# Patient Record
Sex: Female | Born: 2000 | Race: Black or African American | Hispanic: No | Marital: Single | Smoking: Light tobacco smoker
Health system: Southern US, Community
[De-identification: ages and names within clinical notes are randomized; demographics above are authoritative.]

## PROBLEM LIST (undated history)

## (undated) DIAGNOSIS — F329 Major depressive disorder, single episode, unspecified: Secondary | ICD-10-CM

## (undated) DIAGNOSIS — J302 Other seasonal allergic rhinitis: Secondary | ICD-10-CM

## (undated) DIAGNOSIS — F32A Depression, unspecified: Secondary | ICD-10-CM

---
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
## 2018-05-22 ENCOUNTER — Encounter (HOSPITAL_COMMUNITY): Payer: Self-pay | Admitting: Emergency Medicine

## 2018-05-22 ENCOUNTER — Other Ambulatory Visit: Payer: Self-pay

## 2018-05-22 ENCOUNTER — Emergency Department (HOSPITAL_COMMUNITY): Payer: Medicaid Other

## 2018-05-22 ENCOUNTER — Ambulatory Visit (HOSPITAL_COMMUNITY)
Admission: EM | Admit: 2018-05-22 | Discharge: 2018-05-22 | Disposition: A | Discharge status: Home / Self Care | Payer: Medicaid Other | Attending: Family Medicine | Admitting: Family Medicine

## 2018-05-22 ENCOUNTER — Emergency Department (HOSPITAL_COMMUNITY)
Admission: EM | Admit: 2018-05-22 | Discharge: 2018-05-22 | Disposition: A | Discharge status: Home / Self Care | Payer: Medicaid Other | Attending: Emergency Medicine | Admitting: Emergency Medicine

## 2018-05-22 DIAGNOSIS — Y999 Unspecified external cause status: Secondary | ICD-10-CM | POA: Insufficient documentation

## 2018-05-22 DIAGNOSIS — W2103XA Struck by baseball, initial encounter: Secondary | ICD-10-CM | POA: Insufficient documentation

## 2018-05-22 DIAGNOSIS — Y929 Unspecified place or not applicable: Secondary | ICD-10-CM | POA: Diagnosis not present

## 2018-05-22 DIAGNOSIS — S0592XA Unspecified injury of left eye and orbit, initial encounter: Secondary | ICD-10-CM | POA: Diagnosis present

## 2018-05-22 DIAGNOSIS — S02832A Fracture of medial orbital wall, left side, initial encounter for closed fracture: Secondary | ICD-10-CM | POA: Diagnosis not present

## 2018-05-22 DIAGNOSIS — S058X2A Other injuries of left eye and orbit, initial encounter: Secondary | ICD-10-CM

## 2018-05-22 DIAGNOSIS — Y9364 Activity, baseball: Secondary | ICD-10-CM | POA: Diagnosis not present

## 2018-05-22 DIAGNOSIS — F17210 Nicotine dependence, cigarettes, uncomplicated: Secondary | ICD-10-CM | POA: Insufficient documentation

## 2018-05-22 MED ORDER — IBUPROFEN 800 MG PO TABS
800.0000 mg | ORAL_TABLET | Freq: Once | ORAL | Status: AC
  Administered 2018-05-22: 800 mg via ORAL
  Filled 2018-05-22: qty 1

## 2018-05-22 MED ORDER — HYDROCODONE-ACETAMINOPHEN 5-325 MG PO TABS: 1.0000 | ORAL_TABLET | Freq: Four times a day (QID) | ORAL | 0 refills | Status: DC | PRN

## 2018-05-22 MED ORDER — IBUPROFEN 800 MG PO TABS: 800.0000 mg | ORAL_TABLET | Freq: Three times a day (TID) | ORAL | 0 refills | Status: DC | PRN

## 2018-05-22 MED ORDER — OXYCODONE-ACETAMINOPHEN 5-325 MG PO TABS
1.0000 | ORAL_TABLET | Freq: Once | ORAL | Status: AC
  Administered 2018-05-22: 1 via ORAL
  Filled 2018-05-22: qty 1

## 2018-05-22 NOTE — Discharge Instructions (Signed)
Please go to the ER for imaging and follow up management.

## 2018-05-22 NOTE — ED Notes (Signed)
Pt back from CT °

## 2018-05-22 NOTE — ED Provider Notes (Signed)
[facility] Provider Note   CSN: [id number] Arrival date & time: 05/22/18  1602    History   Chief Complaint Chief Complaint  Patient presents with   Eye Injury    left    HPI Sonia Sanders is a 18 y.o. female.     The history is provided by the patient and medical records. No language interpreter was used.  Eye Injury    Sonia Sanders is a 18 y.o. female who presents to the Emergency Department from urgent care for evaluation after injury to left eye yesterday night.  Patient states that she was playing baseball with her family when she was accidentally struck to the left eye with a baseball.  Pain occurred immediately.  There was no loss of consciousness.  She woke up this morning with increased swelling and bruising around the eyelid.  She does not feel as if she has had any visual changes, other than the light causing blurred vision/pain.  She did take Tylenol last night, but has not had any medication today.  She does wear glasses, but not contacts.  At urgent care, she was evaluated and told to come to the emergency department for further work-up.   History reviewed. No pertinent past medical history.  There are no active problems to display for this patient.   History reviewed. No pertinent surgical history.   OB History   No obstetric history on file.      Home Medications    Prior to Admission medications   Medication Sig Start Date End Date Taking? Authorizing Provider  docusate sodium (COLACE) 100 MG capsule Take 1 capsule (100 mg total) by mouth every 12 (twelve) hours. Patient not taking: Reported on 05/22/2018 11/28/17   Roxy HorsemanBrowning, Robert, PA-C  HYDROcodone-acetaminophen (NORCO) 5-325 MG tablet Take 1 tablet by mouth every 6 (six) hours as needed for moderate pain. 05/22/18   Dezire Turk, Chase PicketJaime Pilcher, PA-C  ibuprofen (ADVIL) 800 MG tablet Take 1 tablet (800 mg total) by mouth every 8 (eight) hours as needed. 05/22/18    Greycen Felter, Chase PicketJaime Pilcher, PA-C    Family History No family history on file.  Social History Social History   Tobacco Use   Smoking status: Light Tobacco Smoker   Smokeless tobacco: Never Used   Tobacco comment: occ. smokes black and milds  Substance Use Topics   Alcohol use: No   Drug use: No     Allergies   Grapefruit extract   Review of Systems Review of Systems  HENT: Positive for facial swelling. Negative for sore throat and trouble swallowing.   Eyes: Positive for photophobia, pain and redness. Negative for visual disturbance.  All other systems reviewed and are negative.    Physical Exam Updated Vital Signs BP 113/70 (BP Location: Right Arm)    Pulse 76    Temp 98.1 F (36.7 C) (Oral)    Resp 16    Ht 5\' 2"  (1.575 m)    Wt 54 kg    LMP 05/01/2018    SpO2 97%    BMI 21.77 kg/m   Physical Exam Vitals signs and nursing note reviewed.  Constitutional:      General: She is not in acute distress.    Appearance: She is well-developed.  HENT:     Head: Normocephalic and atraumatic.  Eyes:     Comments: EOM's intact, however does have pain with looking down and to the right. Subconjunctival hemorrhage to left lateral eye. PERRL.  Neck:     Musculoskeletal: Neck supple.  Cardiovascular:     Rate and Rhythm: Normal rate and regular rhythm.     Heart sounds: Normal heart sounds. No murmur.  Pulmonary:     Effort: Pulmonary effort is normal. No respiratory distress.     Breath sounds: Normal breath sounds.  Skin:    General: Skin is warm and dry.  Neurological:     Mental Status: She is alert and oriented to person, place, and time.      ED Treatments / Results  Labs (all labs ordered are listed, but only abnormal results are displayed) Labs Reviewed - No data to display  EKG None  Radiology Ct Orbits Wo Contrast  Result Date: 05/22/2018 CLINICAL DATA:  The patient was struck in the left eye by a baseball today. Pain. Initial encounter. EXAM: CT  ORBITS WITHOUT CONTRAST TECHNIQUE: Multidetector CT images were obtained using the standard protocol without intravenous contrast. COMPARISON:  None. FINDINGS: Orbits: The patient has a fracture of the medial wall of the left orbit with depression of up to approximately 0.3 cm. No other fracture is identified. Mandibular condyles are located. Visualized sinuses: Tiny amount of hemorrhage is seen in the left ethmoid air cells at the patient's fracture and there is mild mucosal thickening in the left frontal sinus. Soft tissues: Soft tissue contusion about the left eye in noted. Limited intracranial: Normal. IMPRESSION: Mildly depressed fracture of the medial wall of the left orbit. No other fracture is identified. Negative for muscle entrapment or other orbital abnormality. Mild mucosal thickening left frontal sinus. Electronically Signed   By: Drusilla Kanner M.D.   On: 05/22/2018 18:18    Procedures Procedures (including critical care time)  Medications Ordered in ED Medications  oxyCODONE-acetaminophen (PERCOCET/ROXICET) 5-325 MG per tablet 1 tablet (1 tablet Oral Given 05/22/18 1737)  ibuprofen (ADVIL) tablet 800 mg (800 mg Oral Given 05/22/18 1737)     Initial Impression / Assessment and Plan / ED Course  I have reviewed the triage vital signs and the nursing notes.  Pertinent labs & imaging results that were available during my care of the patient were reviewed by me and considered in my medical decision making (see chart for details).       Sonia Sanders is a 18 y.o. female who presents to ED urgent care for evaluation of left thigh pain/swelling after being struck with a baseball to the eye last night.  At urgent care, patient did have visual acuity and IOP's checked. IOP of affected eye was 23.  Visual acuity of 20/50 in affected eye with 20/20 bilaterally and to the right eye.  Patient does state that she typically wears glasses, but does not have them with her.  She has good EOMs but  has pain looking both to the right and downward.  No hyphema noted.  CT of the orbits was performed showing the depressed fracture of the medial wall of the left orbit.  No entrapment or other abnormalities were noted.   7:04 PM - Spoke with Dr. Jearld Fenton, ENT facial trauma, who recommends treating symptomatically with follow up in ENT office in about 5 days. No prophylactic ABX needed.   Discussed plan of care with patient who is in agreement with this plan.  [place] controlled substance database consulted with no active prescriptions.  We will give short course of pain medication over the next few days until she can follow-up with ENT.  Reasons to return to the  emergency department were discussed and all questions were answered.   Final Clinical Impressions(s) / ED Diagnoses   Final diagnoses:  Closed fracture of medial wall of left orbit, initial encounter    ED Discharge Orders         Ordered    ibuprofen (ADVIL) 800 MG tablet  Every 8 hours PRN     05/22/18 1921    HYDROcodone-acetaminophen (NORCO) 5-325 MG tablet  Every 6 hours PRN     05/22/18 1921           Mea Ozga, Chase Picket, PA-C 05/22/18 1925    Tilden Fossa, MD 05/23/18 2315

## 2018-05-22 NOTE — ED Provider Notes (Signed)
[facility]    CSN: [id number] Arrival date & time: 05/22/18  1437     History   Chief Complaint Chief Complaint  Patient presents with   Eye Injury    HPI Lucky Cowboyrmani M Kubicki is a 18 y.o. female.   Lucky CowboyArmani M Mallet presents with complaints of left eye pain. Last night while playing with her family she accidentally was struck with a baseball to her left eye ball. Immediately had pain. No loss of consciousness. She feels that the pain has worsened. Light sensitive. Bruising and swelling to the lid. Denies any significant or noticeable vision changes. Took ibuprofen last night but no medications today for her symptoms. Pain 8/10. She sometimes wears glasses for decreased distance vision. Doesn't wear contacts. She noticed increased clear tears from the eye, no other drainage. Doesn't follow regularly with her eye doctor. Without contributing medical history.      ROS per HPI, negative if not otherwise mentioned.      History reviewed. No pertinent past medical history.  There are no active problems to display for this patient.   History reviewed. No pertinent surgical history.  OB History   No obstetric history on file.      Home Medications    Prior to Admission medications   Medication Sig Start Date End Date Taking? Authorizing Provider  acetaminophen (TYLENOL) 325 MG tablet Take 325-650 mg by mouth every 6 (six) hours as needed (for pain or headaches).    [provider]  azelaic acid (AZELEX) 20 % cream Apply 1 application topically daily as needed (to affected areas of neck, chest, and shoulders for irritation).     [provider]  docusate sodium (COLACE) 100 MG capsule Take 1 capsule (100 mg total) by mouth every 12 (twelve) hours. 11/28/17   Roxy HorsemanBrowning, Robert, PA-C  ibuprofen (ADVIL,MOTRIN) 200 MG tablet Take 200-400 mg by mouth every 6 (six) hours as needed (for pain or headaches).    [provider]  methocarbamol (ROBAXIN)  500 MG tablet Take 1 tablet (500 mg total) by mouth every 8 (eight) hours as needed for muscle spasms. 05/15/17   Georgetta HaberBurky, Yicel Shannon B, NP  naproxen (NAPROSYN) 500 MG tablet Take 1 tablet (500 mg total) by mouth 2 (two) times daily. 08/14/17   Wurst, [place], PA-C  polyethylene glycol powder (GLYCOLAX/MIRALAX) powder Take 17 g by mouth 2 (two) times daily. 11/28/17   Roxy HorsemanBrowning, Robert, PA-C  sodium phosphate (FLEET) 7-19 GM/118ML ENEM Place 133 mLs (1 enema total) rectally daily as needed for severe constipation. 11/28/17   Roxy HorsemanBrowning, Robert, PA-C    Family History No family history on file.  Social History Social History   Tobacco Use   Smoking status: Light Tobacco Smoker   Smokeless tobacco: Never Used   Tobacco comment: occ. smokes black and milds  Substance Use Topics   Alcohol use: No   Drug use: No     Allergies   Grapefruit extract   Review of Systems Review of Systems   Physical Exam Triage Vital Signs ED Triage Vitals [05/22/18 1507]  Enc Vitals Group     BP 114/79     Pulse Rate 64     Resp 16     Temp 97.6 F (36.4 C)     Temp src      SpO2 100 %     Weight      Height      Head Circumference      Peak Flow  Pain Score 8     Pain Loc      Pain Edu?      Excl. in GC?    No data found.  Updated Vital Signs BP 114/79    Pulse 64    Temp 97.6 F (36.4 C)    Resp 16    LMP 05/01/2018    SpO2 100%   Visual Acuity Right Eye Distance:   Left Eye Distance:   Bilateral Distance:    Right Eye Near: R Near: 20/20 Left Eye Near:  L Near: 20/50 Bilateral Near:  20/20  Physical Exam Constitutional:      General: She is not in acute distress.    Appearance: She is well-developed.  Eyes:     Intraocular pressure: Right eye pressure is 13 mmHg. Left eye pressure is 23 mmHg. Measurements were taken using a handheld tonometer.    Extraocular Movements:     Right eye: Normal extraocular motion.     Left eye: Normal extraocular motion.      Conjunctiva/sclera:     Left eye: Hemorrhage present.     Pupils:     Left eye: Pupil is sluggish. Pupil is round and reactive. No corneal abrasion or fluorescein uptake.     Slit lamp exam:    Left eye: Photophobia present. No hyphema.     Comments: No obvious hyphema present to left eye; swelling to left upper lateral and lower eye lid; conjunctival hemorrhage to left lateral eye; pain to left eye with ROM in right direction  Cardiovascular:     Rate and Rhythm: Normal rate and regular rhythm.     Heart sounds: Normal heart sounds.  Pulmonary:     Effort: Pulmonary effort is normal.     Breath sounds: Normal breath sounds.  Skin:    General: Skin is warm and dry.  Neurological:     Mental Status: She is alert and oriented to person, place, and time.      UC Treatments / Results  Labs (all labs ordered are listed, but only abnormal results are displayed) Labs Reviewed - No data to display  EKG None  Radiology No results found.  Procedures Procedures (including critical care time)  Medications Ordered in UC Medications - No data to display  Initial Impression / Assessment and Plan / UC Course  I have reviewed the triage vital signs and the nursing notes.  Pertinent labs & imaging results that were available during my care of the patient were reviewed by me and considered in my medical decision making (see chart for details).     Baseball trauma to left eye, last night. Photophobia, pain with ROm, sluggish left pupil. No obvious hyphema. Spoke with on-call ophthalmologist Dr. Sherrine Maples who does recommend facial CT at this time. He is agreeable to ER reaching out to him with results as he still may see patient tonight or tomorrow morning as needed. Patient agreeable to plan and self transport to ER.  Final Clinical Impressions(s) / UC Diagnoses   Final diagnoses:  Eye trauma, superficial, left, initial encounter     Discharge Instructions     Please go to the ER for  imaging and follow up management.     ED Prescriptions    None     Controlled Substance Prescriptions [place] Controlled Substance Registry consulted? Not Applicable   Georgetta Haber, NP 05/22/18 1558

## 2018-05-22 NOTE — Discharge Instructions (Signed)
It was my pleasure taking care of you today!   Ice / cold compress to area to help with swelling and pain.  Ibuprofen for mild to moderate pain. Norco only as needed for severe pain - This can make you very drowsy - please do not drink alcohol, operate heavy machinery or drive on this medication.   Please call the doctor listed Monday morning to schedule a follow up appointment. I spoke with Dr. Jearld Fenton who recommended you see him or one of his colleagues in 5 days.   Return to ER for new or worsening symptoms, any additional concerns.

## 2018-05-22 NOTE — ED Triage Notes (Signed)
Pt states she was hit in the face with a baseball. redness to left eye with pain radiating into the left jaw. No visual field changes, does have sensitivity to light.

## 2018-05-22 NOTE — ED Triage Notes (Signed)
Pt states someone threw a baseball last night and it hit her in the L eye. Pt has swelling to L eye, states light is bothering it.

## 2018-05-22 NOTE — ED Notes (Signed)
Patient transported to CT °

## 2018-08-24 ENCOUNTER — Encounter (HOSPITAL_COMMUNITY): Payer: Self-pay | Admitting: Emergency Medicine

## 2018-08-24 ENCOUNTER — Emergency Department (HOSPITAL_COMMUNITY): Payer: Medicaid Other

## 2018-08-24 ENCOUNTER — Emergency Department (HOSPITAL_COMMUNITY)
Admission: EM | Admit: 2018-08-24 | Discharge: 2018-08-24 | Disposition: A | Discharge status: Home / Self Care | Payer: Medicaid Other | Attending: Emergency Medicine | Admitting: Emergency Medicine

## 2018-08-24 ENCOUNTER — Other Ambulatory Visit: Payer: Self-pay

## 2018-08-24 DIAGNOSIS — Y9289 Other specified places as the place of occurrence of the external cause: Secondary | ICD-10-CM | POA: Insufficient documentation

## 2018-08-24 DIAGNOSIS — Y999 Unspecified external cause status: Secondary | ICD-10-CM | POA: Diagnosis not present

## 2018-08-24 DIAGNOSIS — Y29XXXA Contact with blunt object, undetermined intent, initial encounter: Secondary | ICD-10-CM | POA: Insufficient documentation

## 2018-08-24 DIAGNOSIS — M25531 Pain in right wrist: Secondary | ICD-10-CM | POA: Diagnosis not present

## 2018-08-24 DIAGNOSIS — M79641 Pain in right hand: Secondary | ICD-10-CM

## 2018-08-24 DIAGNOSIS — F17210 Nicotine dependence, cigarettes, uncomplicated: Secondary | ICD-10-CM | POA: Insufficient documentation

## 2018-08-24 DIAGNOSIS — Y9389 Activity, other specified: Secondary | ICD-10-CM | POA: Insufficient documentation

## 2018-08-24 MED ORDER — NAPROXEN 250 MG PO TABS
500.0000 mg | ORAL_TABLET | Freq: Once | ORAL | Status: AC
  Administered 2018-08-24: 500 mg via ORAL
  Filled 2018-08-24: qty 2

## 2018-08-24 MED ORDER — NAPROXEN 500 MG PO TABS: 500.0000 mg | ORAL_TABLET | Freq: Two times a day (BID) | ORAL | 0 refills | Status: DC

## 2018-08-24 NOTE — ED Triage Notes (Signed)
C/o R hand pain since punching a washing machine pta.

## 2018-08-24 NOTE — ED Provider Notes (Signed)
[facility]   CSN: [id number] Arrival date & time: 08/24/18  0319     History   Chief Complaint Chief Complaint  Patient presents with   Hand Pain    HPI Sonia Sanders is a 18 y.o. female.     The history is provided by the patient and medical records.     18 year old female presenting to the ED with right hand and wrist pain.  States she was very upset tonight as her mother recently passed away and she punched several items in the home, the last of which she thinks was the dryer which caused a lot of pain to her volar right wrist.  He states she has increased pain when moving her fingers or making a fist.  She denies any numbness or weakness.  She is right-hand dominant.  No intervention tried prior to arrival.  History reviewed. No pertinent past medical history.  There are no active problems to display for this patient.   History reviewed. No pertinent surgical history.   OB History   No obstetric history on file.      Home Medications    Prior to Admission medications   Medication Sig Start Date End Date Taking? Authorizing Provider  docusate sodium (COLACE) 100 MG capsule Take 1 capsule (100 mg total) by mouth every 12 (twelve) hours. Patient not taking: Reported on 05/22/2018 11/28/17   Roxy HorsemanBrowning, Robert, PA-C  HYDROcodone-acetaminophen (NORCO) 5-325 MG tablet Take 1 tablet by mouth every 6 (six) hours as needed for moderate pain. 05/22/18   Ward, Chase PicketJaime Pilcher, PA-C  ibuprofen (ADVIL) 800 MG tablet Take 1 tablet (800 mg total) by mouth every 8 (eight) hours as needed. 05/22/18   Ward, Chase PicketJaime Pilcher, PA-C    Family History No family history on file.  Social History Social History   Tobacco Use   Smoking status: Light Tobacco Smoker   Smokeless tobacco: Never Used   Tobacco comment: occ. smokes black and milds  Substance Use Topics   Alcohol use: No   Drug use: No     Allergies   Grapefruit  extract   Review of Systems Review of Systems  Musculoskeletal: Positive for arthralgias.  All other systems reviewed and are negative.    Physical Exam Updated Vital Signs BP (!) 103/56 (BP Location: Left Arm)    Pulse 84    Temp 98.7 F (37.1 C) (Oral)    Resp 20    LMP 08/19/2018    SpO2 99%   Physical Exam Vitals signs and nursing note reviewed.  Constitutional:      Appearance: She is well-developed.  HENT:     Head: Normocephalic and atraumatic.  Eyes:     Conjunctiva/sclera: Conjunctivae normal.     Pupils: Pupils are equal, round, and reactive to light.  Neck:     Musculoskeletal: Normal range of motion.  Cardiovascular:     Rate and Rhythm: Normal rate and regular rhythm.     Heart sounds: Normal heart sounds.  Pulmonary:     Effort: Pulmonary effort is normal.     Breath sounds: Normal breath sounds.  Abdominal:     General: Bowel sounds are normal.     Palpations: Abdomen is soft.  Musculoskeletal: Normal range of motion.     Comments: Small contusion noted to the radial aspect of right wrist, there is no acute deformity, pain with attempted movement of the fingers and wrist, no significant swelling, radial pulse intact,  normal distal sensation and cap refill  Skin:    General: Skin is warm and dry.  Neurological:     Mental Status: She is alert and oriented to person, place, and time.      ED Treatments / Results  Labs (all labs ordered are listed, but only abnormal results are displayed) Labs Reviewed - No data to display  EKG None  Radiology Dg Hand Complete Right  Result Date: 08/24/2018 CLINICAL DATA:  Initial evaluation for acute right hand pain status post trauma. EXAM: RIGHT HAND - COMPLETE 3+ VIEW COMPARISON:  None. FINDINGS: There is no evidence of fracture or dislocation. There is no evidence of arthropathy or other focal bone abnormality. Soft tissues are unremarkable. IMPRESSION: No acute osseous abnormality about the right hand.  Electronically Signed   By: Jeannine Boga M.D.   On: 08/24/2018 03:47    Procedures .Splint Application  Date/Time: 08/24/2018 4:19 AM Performed by: Larene Pickett, PA-C Authorized by: Larene Pickett, PA-C   Consent:    Consent obtained:  Verbal   Consent given by:  Patient   Risks discussed:  Numbness and discoloration   Alternatives discussed:  No treatment and delayed treatment Pre-procedure details:    Sensation:  Normal Procedure details:    Laterality:  Right   Location:  Wrist   Wrist:  R wrist   Strapping: yes     Cast type:  Short arm   Splint type:  Wrist   Supplies:  Aluminum splint Post-procedure details:    Pain:  Improved   Sensation:  Normal   Patient tolerance of procedure:  Tolerated well, no immediate complications   (including critical care time)  Medications Ordered in ED Medications - No data to display   Initial Impression / Assessment and Plan / ED Course  I have reviewed the triage vital signs and the nursing notes.  Pertinent labs & imaging results that were available during my care of the patient were reviewed by me and considered in my medical decision making (see chart for details).  18 year old female presenting to the ED with right hand and wrist pain after punching a dryer at home.  She has been upset to the passing of her mother.  She has area of contusion to the radial aspect of right volar wrist but otherwise exam is atraumatic.  Her hand is neurovascular intact.  X-ray of that hand obtained, however this is low enough to fully visualize wrist and there are no bony findings noted.  Patient placed in wrist splint for comfort, start NSAIDs.  Given hand surgery follow-up for any acute complications or if no improvement with supportive care.  Return here for any new/acute changes.  Final Clinical Impressions(s) / ED Diagnoses   Final diagnoses:  Right hand pain  Right wrist pain    ED Discharge Orders         Ordered     naproxen (NAPROSYN) 500 MG tablet  2 times daily with meals     08/24/18 0421           Larene Pickett, PA-C 08/24/18 0430    Fatima Blank, MD 08/24/18 [phone]

## 2018-08-24 NOTE — Discharge Instructions (Signed)
Wear splint for comfort, you can loosen and tighten as needed.  Ice will help too. Take naprosyn as directed. Follow-up with hand specialist if not improving in the next week or if worsening. Return here for any new/acute changes.

## 2018-08-24 NOTE — ED Notes (Signed)
Pt given a ice pack for rt forearm

## 2018-08-24 NOTE — ED Notes (Signed)
Patient verbalized understanding of discharge instructions. Opportunity for questions were provided. Pt. ambulatory and discharged home.

## 2018-12-10 ENCOUNTER — Emergency Department (HOSPITAL_COMMUNITY)
Admission: EM | Admit: 2018-12-10 | Discharge: 2018-12-10 | Disposition: A | Discharge status: Home / Self Care | Payer: Medicaid Other | Attending: Emergency Medicine | Admitting: Emergency Medicine

## 2018-12-10 ENCOUNTER — Encounter (HOSPITAL_COMMUNITY): Payer: Self-pay | Admitting: Emergency Medicine

## 2018-12-10 DIAGNOSIS — F329 Major depressive disorder, single episode, unspecified: Secondary | ICD-10-CM

## 2018-12-10 DIAGNOSIS — R45851 Suicidal ideations: Secondary | ICD-10-CM | POA: Diagnosis not present

## 2018-12-10 DIAGNOSIS — Z720 Tobacco use: Secondary | ICD-10-CM | POA: Diagnosis not present

## 2018-12-10 DIAGNOSIS — F32A Depression, unspecified: Secondary | ICD-10-CM

## 2018-12-10 HISTORY — DX: Depression, unspecified: F32.A

## 2018-12-10 LAB — COMPREHENSIVE METABOLIC PANEL
ALT: 13 U/L (ref 0–44)
AST: 33 U/L (ref 15–41)
Albumin: 4.5 g/dL (ref 3.5–5.0)
Alkaline Phosphatase: 46 U/L (ref 38–126)
Anion gap: 11 (ref 5–15)
BUN: 12 mg/dL (ref 6–20)
CO2: 24 mmol/L (ref 22–32)
Calcium: 9.8 mg/dL (ref 8.9–10.3)
Chloride: 105 mmol/L (ref 98–111)
Creatinine, Ser: 0.76 mg/dL (ref 0.44–1.00)
GFR calc Af Amer: >60 mL/min (ref >60)
GFR calc non Af Amer: >60 mL/min (ref >60)
Glucose, Bld: 87 mg/dL (ref 70–99)
Potassium: 3.9 mmol/L (ref 3.5–5.1)
Sodium: 140 mmol/L (ref 135–145)
Total Bilirubin: 1 mg/dL (ref 0.3–1.2)
Total Protein: 7.3 g/dL (ref 6.5–8.1)

## 2018-12-10 LAB — ACETAMINOPHEN LEVEL: Acetaminophen (Tylenol), Serum: <10 ug/mL — ABNORMAL LOW (ref 10–30)

## 2018-12-10 LAB — CBC
HCT: 39.9 % (ref 36.0–46.0)
Hemoglobin: 12.9 g/dL (ref 12.0–15.0)
MCH: 30.4 pg (ref 26.0–34.0)
MCHC: 32.3 g/dL (ref 30.0–36.0)
MCV: 94.1 fL (ref 80.0–100.0)
Platelets: 319 10*3/uL (ref 150–400)
RBC: 4.24 MIL/uL (ref 3.87–5.11)
RDW: 13.6 % (ref 11.5–15.5)
WBC: 9.8 10*3/uL (ref 4.0–10.5)
nRBC: 0 % (ref 0.0–0.2)

## 2018-12-10 LAB — ETHANOL: Alcohol, Ethyl (B): <10 mg/dL (ref <10)

## 2018-12-10 LAB — SALICYLATE LEVEL: Salicylate Lvl: <7 mg/dL (ref 2.8–30.0)

## 2018-12-10 NOTE — ED Notes (Signed)
Pt given back belongings and discharged

## 2018-12-10 NOTE — Discharge Instructions (Addendum)
Contact a health care provider if: °You stop taking your antidepressant medicines, and you have any of these symptoms: °Nausea. °Headache. °Feeling lightheaded. °Chills and body aches. °Not being able to sleep (insomnia). °You or your friends and family think your depression is getting worse. °Get help right away if: °You have thoughts of hurting yourself or others. °

## 2018-12-10 NOTE — ED Provider Notes (Signed)
[facility]ER[facility]ENCY DEPARTMENT Provider Note   CSN: [id number] Arrival date & time: 12/10/18  1819     History   Chief Complaint Chief Complaint  Patient presents with   Suicidal    HPI Sonia Sanders is a 18 y.o. female who presents to the emergency department for depression.  Patient states that she has never sought help for her depression but has been depressed since she was about 18 years old.  She complains of feeling sad at all times.  She states that she cries for no reason she cannot find a way to feel any joy or happiness.  She is lost interest in things that make her happy.  She has passive suicidal ideation and sometimes thinks about jumping off of a bridge but has no active plans to harm herself and states that she does not want to kill herself but also does not have the energy to do it.  She denies any homicidal ideation.  She is not seeking psychiatric hospitalization.  She uses marijuana and states "I drink sometimes when I am sad" but denies daily drinking.     HPI  Past Medical History:  Diagnosis Date   Depression     There are no active problems to display for this patient.   History reviewed. No pertinent surgical history.   OB History   No obstetric history on file.      Home Medications    Prior to Admission medications   Medication Sig Start Date End Date Taking? Authorizing Provider  docusate sodium (COLACE) 100 MG capsule Take 1 capsule (100 mg total) by mouth every 12 (twelve) hours. Patient not taking: Reported on 05/22/2018 11/28/17   Montine Circle, PA-C  HYDROcodone-acetaminophen (NORCO) 5-325 MG tablet Take 1 tablet by mouth every 6 (six) hours as needed for moderate pain. 05/22/18   Ward, Ozella Almond, PA-C  ibuprofen (ADVIL) 800 MG tablet Take 1 tablet (800 mg total) by mouth every 8 (eight) hours as needed. 05/22/18   Ward, Ozella Almond, PA-C  naproxen (NAPROSYN) 500 MG tablet Take 1 tablet (500 mg total) by mouth  2 (two) times daily with a meal. 08/24/18   Larene Pickett, PA-C    Family History No family history on file.  Social History Social History   Tobacco Use   Smoking status: Light Tobacco Smoker   Smokeless tobacco: Never Used   Tobacco comment: occ. smokes black and milds  Substance Use Topics   Alcohol use: No   Drug use: No     Allergies   Grapefruit extract   Review of Systems Review of Systems  Constitutional: Negative.   HENT: Negative.   Eyes: Negative.   Respiratory: Negative.   Cardiovascular: Negative.   Gastrointestinal: Negative.   Genitourinary: Negative.   Musculoskeletal: Negative.   Neurological: Negative.   Psychiatric/Behavioral: Positive for dysphoric mood, sleep disturbance and suicidal ideas. Negative for confusion, hallucinations and self-injury. The patient is not nervous/anxious.      Physical Exam Updated Vital Signs BP 125/83    Pulse 94    Temp 98.6 F (37 C) (Oral)    Resp 16    Ht 5\' 3"  (1.6 m)    Wt 48.5 kg    LMP 12/07/2018    SpO2 100%    BMI 18.95 kg/m   Physical Exam Vitals signs and nursing note reviewed.  Constitutional:      General: She is not in acute distress.    Appearance: She  is well-developed. She is not diaphoretic.  HENT:     Head: Normocephalic and atraumatic.  Eyes:     General: No scleral icterus.    Conjunctiva/sclera: Conjunctivae normal.  Neck:     Musculoskeletal: Normal range of motion.  Cardiovascular:     Rate and Rhythm: Normal rate and regular rhythm.     Heart sounds: Normal heart sounds. No murmur. No friction rub. No gallop.   Pulmonary:     Effort: Pulmonary effort is normal. No respiratory distress.     Breath sounds: Normal breath sounds.  Abdominal:     General: Bowel sounds are normal. There is no distension.     Palpations: Abdomen is soft. There is no mass.     Tenderness: There is no abdominal tenderness. There is no guarding.  Skin:    General: Skin is warm and dry.   Neurological:     Mental Status: She is alert and oriented to person, place, and time.  Psychiatric:        Attention and Perception: Attention normal.        Mood and Affect: Mood is depressed. Affect is blunt and flat.        Speech: Speech normal.        Behavior: Behavior normal.        Thought Content: Thought content normal.      ED Treatments / Results  Labs (all labs ordered are listed, but only abnormal results are displayed) Labs Reviewed  ACETAMINOPHEN LEVEL - Abnormal; Notable for the following components:      Result Value   Acetaminophen (Tylenol), Serum <10 (*)    All other components within normal limits  COMPREHENSIVE METABOLIC PANEL  ETHANOL  SALICYLATE LEVEL  CBC  RAPID URINE DRUG SCREEN, HOSP PERFORMED    EKG None  Radiology No results found.  Procedures Procedures (including critical care time)  Medications Ordered in ED Medications - No data to display   Initial Impression / Assessment and Plan / ED Course  I have reviewed the triage vital signs and the nursing notes.  Pertinent labs & imaging results that were available during my care of the patient were reviewed by me and considered in my medical decision making (see chart for details).        18 year old female here with complaint of depression.  I personally reviewed the patient's lab results which show no abnormalities.  Patient is not actively suicidal.  She does not want to be hospitalized.  She is not an active risk for completed suicide on my assessment.  Patient labs show no acute abnormalities.  I consulted with case management who gave the patient outpatient resources.  She will call family services tomorrow morning.  Appears appropriate for discharge at this time  Final Clinical Impressions(s) / ED Diagnoses   Final diagnoses:  Depression, unspecified depression type  Suicidal ideations    ED Discharge Orders    None       Arthor Captain, PA-C 12/10/18 2213     Benjiman Core, MD 12/10/18 2324

## 2018-12-10 NOTE — ED Triage Notes (Signed)
Pt. Stated, Im trying to figure out what Im doing or why Im here. Sonia Sanders been depresse and suicidal since I was 18 years old.

## 2019-02-08 ENCOUNTER — Encounter (HOSPITAL_COMMUNITY): Payer: Self-pay

## 2019-02-08 ENCOUNTER — Ambulatory Visit (HOSPITAL_COMMUNITY)
Admission: EM | Admit: 2019-02-08 | Discharge: 2019-02-08 | Disposition: A | Discharge status: Home / Self Care | Payer: Medicaid Other | Attending: Family Medicine | Admitting: Family Medicine

## 2019-02-08 ENCOUNTER — Other Ambulatory Visit: Payer: Self-pay

## 2019-02-08 DIAGNOSIS — H00022 Hordeolum internum right lower eyelid: Secondary | ICD-10-CM

## 2019-02-08 MED ORDER — ERYTHROMYCIN 5 MG/GM OP OINT: TOPICAL_OINTMENT | OPHTHALMIC | 0 refills | Status: DC

## 2019-02-08 NOTE — Discharge Instructions (Signed)
Stye  -Warm compresses multiple times a day with massage afterward  - Do not wear contacts or make up  - Keep lid clean, may use baby shampoo diluted  - Erythromycin ointment 4-6 times daily x 1 week - Return if increasing, redness, pain or swelling, decreased vision.

## 2019-02-08 NOTE — ED Triage Notes (Signed)
Pt c/o right eye pain, irritation, swelling x1 week. Denies injury to area, denies drainage. Denies nasal congestion, but pt audibly clearing secretions.

## 2019-02-08 NOTE — ED Provider Notes (Signed)
[facility]    CSN: [id number] Arrival date & time: 02/08/19  1305      History   Chief Complaint Chief Complaint  Patient presents with  . Eye Problem    HPI Sonia Sanders is a 19 y.o. female no significant past medical history presenting today for evaluation of right eye pain and irritation.  Patient states that her symptoms began approximately 1 week ago.  She has had some pain and swelling noted to her lower outer eyelid.  Recently she has developed some light sensitivity.  She has had some watery drainage.  Denies any pus.  Denies noticing any redness to her eye.  Denies any vision changes.  Has previously worn contacts, but has not worn them in the past 3 months.  HPI  Past Medical History:  Diagnosis Date  . Depression     There are no problems to display for this patient.   History reviewed. No pertinent surgical history.  OB History   No obstetric history on file.      Home Medications    Prior to Admission medications   Medication Sig Start Date End Date Taking? Authorizing Provider  erythromycin ophthalmic ointment Place a 1/2 inch ribbon of ointment into the lower eyelid 4-6 times daily x 1 week 02/08/19   Gyanna Jarema, Junius Creamer, PA-C    Family History Family History  Problem Relation Age of Onset  . Crohn's disease Mother   . Healthy Father     Social History Social History   Tobacco Use  . Smoking status: Light Tobacco Smoker    Types: Cigars  . Smokeless tobacco: Never Used  . Tobacco comment: occ. smokes black and milds  Substance Use Topics  . Alcohol use: Yes  . Drug use: Yes    Types: Marijuana     Allergies   Grapefruit extract   Review of Systems Review of Systems  Constitutional: Negative for activity change, appetite change, chills, fatigue and fever.  HENT: Negative for congestion, ear pain, rhinorrhea, sinus pressure, sore throat and trouble swallowing.   Eyes: Positive for pain. Negative for discharge and  redness.  Respiratory: Negative for cough, chest tightness and shortness of breath.   Cardiovascular: Negative for chest pain.  Gastrointestinal: Negative for abdominal pain, diarrhea, nausea and vomiting.  Musculoskeletal: Negative for myalgias.  Skin: Negative for rash.  Neurological: Negative for dizziness, light-headedness and headaches.     Physical Exam Triage Vital Signs ED Triage Vitals  Enc Vitals Group     BP 02/08/19 1331 102/65     Pulse Rate 02/08/19 1331 (!) 59     Resp 02/08/19 1331 16     Temp 02/08/19 1331 98.7 F (37.1 C)     Temp Source 02/08/19 1331 Oral     SpO2 02/08/19 1331 100 %     Weight --      Height --      Head Circumference --      Peak Flow --      Pain Score 02/08/19 1328 7     Pain Loc --      Pain Edu? --      Excl. in GC? --    No data found.  Updated Vital Signs BP 102/65 (BP Location: Left Arm)   Pulse (!) 59   Temp 98.7 F (37.1 C) (Oral)   Resp 16   LMP 02/03/2019   SpO2 100%   Visual Acuity Right Eye Distance: 20/30 Left Eye Distance: 20/200  Bilateral Distance: 20/30  Right Eye Near:   Left Eye Near:    Bilateral Near:     Physical Exam Vitals and nursing note reviewed.  Constitutional:      Appearance: She is well-developed.     Comments: No acute distress  HENT:     Head: Normocephalic and atraumatic.     Nose: Nose normal.  Eyes:     Extraocular Movements: Extraocular movements intact.     Conjunctiva/sclera: Conjunctivae normal.     Pupils: Pupils are equal, round, and reactive to light.     Comments: Bilateral conjunctive a white, lower internal lid of the right eye with erythema and swelling, mild overlying tenderness, no significant erythema noted to external lid, no consensual photophobia  Cardiovascular:     Rate and Rhythm: Normal rate.  Pulmonary:     Effort: Pulmonary effort is normal. No respiratory distress.  Abdominal:     General: There is no distension.  Musculoskeletal:        General:  Normal range of motion.     Cervical back: Neck supple.  Skin:    General: Skin is warm and dry.  Neurological:     Mental Status: She is alert and oriented to person, place, and time.      UC Treatments / Results  Labs (all labs ordered are listed, but only abnormal results are displayed) Labs Reviewed - No data to display  EKG   Radiology No results found.  Procedures Procedures (including critical care time)  Medications Ordered in UC Medications - No data to display  Initial Impression / Assessment and Plan / UC Course  I have reviewed the triage vital signs and the nursing notes.  Pertinent labs & imaging results that were available during my care of the patient were reviewed by me and considered in my medical decision making (see chart for details).     Exam suggestive of hordeolum.  Will initiate on erythromycin ointment and have apply warm compresses with massage.  No sign of preseptal cellulitis at this time or conjunctivitis.  Follow-up if not improving or worsening.  Discussed strict return precautions. Patient verbalized understanding and is agreeable with plan.  Final Clinical Impressions(s) / UC Diagnoses   Final diagnoses:  Hordeolum internum of right lower eyelid     Discharge Instructions     Stye  -Warm compresses multiple times a day with massage afterward  - Do not wear contacts or make up  - Keep lid clean, may use baby shampoo diluted  - Erythromycin ointment 4-6 times daily x 1 week - Return if increasing, redness, pain or swelling, decreased vision.    ED Prescriptions    Medication Sig Dispense Auth. Provider   erythromycin ophthalmic ointment Place a 1/2 inch ribbon of ointment into the lower eyelid 4-6 times daily x 1 week 3.5 g Penni Penado, Levasy C, PA-C     PDMP not reviewed this encounter.   Janith Lima, PA-C 02/08/19 1352

## 2019-05-05 ENCOUNTER — Encounter (HOSPITAL_COMMUNITY): Payer: Self-pay | Admitting: Emergency Medicine

## 2019-05-05 ENCOUNTER — Emergency Department (HOSPITAL_COMMUNITY)
Admission: EM | Admit: 2019-05-05 | Discharge: 2019-05-05 | Disposition: A | Discharge status: Home / Self Care | Payer: Medicaid Other | Attending: Emergency Medicine | Admitting: Emergency Medicine

## 2019-05-05 ENCOUNTER — Other Ambulatory Visit: Payer: Self-pay

## 2019-05-05 DIAGNOSIS — F17210 Nicotine dependence, cigarettes, uncomplicated: Secondary | ICD-10-CM | POA: Diagnosis not present

## 2019-05-05 DIAGNOSIS — R21 Rash and other nonspecific skin eruption: Secondary | ICD-10-CM | POA: Diagnosis present

## 2019-05-05 DIAGNOSIS — T7840XA Allergy, unspecified, initial encounter: Secondary | ICD-10-CM

## 2019-05-05 DIAGNOSIS — L509 Urticaria, unspecified: Secondary | ICD-10-CM

## 2019-05-05 DIAGNOSIS — L500 Allergic urticaria: Secondary | ICD-10-CM | POA: Diagnosis not present

## 2019-05-05 MED ORDER — DIPHENHYDRAMINE HCL 50 MG/ML IJ SOLN
25.0000 mg | Freq: Once | INTRAMUSCULAR | Status: AC
  Administered 2019-05-05: 25 mg via INTRAMUSCULAR
  Filled 2019-05-05: qty 1

## 2019-05-05 MED ORDER — DEXAMETHASONE SODIUM PHOSPHATE 10 MG/ML IJ SOLN
8.0000 mg | Freq: Once | INTRAMUSCULAR | Status: AC
  Administered 2019-05-05: 8 mg via INTRAMUSCULAR
  Filled 2019-05-05: qty 1

## 2019-05-05 NOTE — ED Provider Notes (Signed)
[facility] Emergency Department Provider Note MRN:  [id number]  Arrival date & time: 05/05/19     Chief Complaint   Allergic Reaction (hives)   History of Present Illness   Sonia Sanders is a 19 y.o. year-old female with no pertinent past medical history presenting to the ED with chief complaint of allergic reaction.  Patient began experiencing itchy hives and rash to her face, arms, torso, back, upper legs this morning at 3 AM, persisting since then. Very itchy, moderately uncomfortable. She denies shortness of breath, no swelling to the face or tongue or throat, no chest pain, no abdominal pain, no vomiting, no diarrhea. 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  Review of Systems  A complete 10 system review of systems was obtained and all systems are negative except as noted in the HPI and PMH.   Patient's Health History    Past Medical History:  Diagnosis Date  . Depression     History reviewed. No pertinent surgical history.  Family History  Problem Relation Age of Onset  . Crohn's disease Mother   . Healthy Father     Social History   Socioeconomic History  . Marital status: Single    Spouse name: Not on file  . Number of children: Not on file  . Years of education: Not on file  . Highest education level: Not on file  Occupational History  . Not on file  Tobacco Use  . Smoking status: Light Tobacco Smoker    Types: Cigars  . Smokeless tobacco: Never Used  . Tobacco comment: occ. smokes black and milds  Substance and Sexual Activity  . Alcohol use: Yes  . Drug use: Yes    Types: Marijuana  . Sexual activity: Not on file  Other Topics Concern  . Not on file  Social History Narrative  . Not on file   Social Determinants of Health   Financial Resource Strain:   . Difficulty of Paying  Living Expenses:   Food Insecurity:   . Worried About Charity fundraiser in the Last Year:   . Arboriculturist in the Last Year:   Transportation Needs:   . Film/video editor (Medical):   Marland Kitchen Lack of Transportation (Non-Medical):   Physical Activity:   . Days of Exercise per Week:   . Minutes of Exercise per Session:   Stress:   . Feeling of Stress :   Social Connections:   . Frequency of Communication with Friends and Family:   . Frequency of Social Gatherings with Friends and Family:   . Attends Religious Services:   . Active Member of Clubs or Organizations:   . Attends Archivist Meetings:   Marland Kitchen Marital Status:   Intimate Partner Violence:   . Fear of Current or Ex-Partner:   . Emotionally Abused:   Marland Kitchen Physically Abused:   . Sexually Abused:      Physical Exam   Vitals:   05/05/19 0355 05/05/19 0645  BP: 108/71 102/70  Pulse: 84 63  Resp: 17 13  Temp: 98 F (36.7 C)   SpO2: 97% 99%    CONSTITUTIONAL: Well-appearing, NAD NEURO:  Alert and oriented x 3, no focal deficits EYES:  eyes equal and reactive ENT/NECK:  no LAD, no JVD CARDIO: Regular rate, well-perfused, normal S1 and S2  PULM:  CTAB no wheezing or rhonchi GI/GU:  normal bowel sounds, non-distended, non-tender MSK/SPINE:  No gross deformities, no edema SKIN: Papular erythematous scattered rash to the upper arms, torso PSYCH:  Appropriate speech and behavior  *Additional and/or pertinent findings included in MDM below  Diagnostic and Interventional Summary    EKG Interpretation  Date/Time:    Ventricular Rate:    PR Interval:    QRS Duration:   QT Interval:    QTC Calculation:   R Axis:     Text Interpretation:        Labs Reviewed - No data to display  No orders to display    Medications  dexamethasone (DECADRON) injection 8 mg (8 mg Intramuscular Given 05/05/19 0950)  diphenhydrAMINE (BENADRYL) injection 25 mg (25 mg Intramuscular Given 05/05/19 0951)     Procedures  /   Critical Care Procedures  ED Course and Medical Decision Making  I have reviewed the triage vital signs, the nursing notes, and pertinent available records from the EMR.  Listed above are laboratory and imaging tests that I personally ordered, reviewed, and interpreted and then considered in my medical decision making (see below for details).      Rash seems consistent with hives related to allergy. Nothing to suggest anaphylaxis or more significant rash related disease. Normal vital signs. Will provide steroids, Benadryl, reassess, anticipating discharge.  Upon reassessment patient is feeling better, continues to have no airway concerns, no shortness of breath, no swelling.  Appropriate for discharge with Benadryl and allergist follow-up.  Elmer Sow. Pilar Plate, MD [facility] [email or web address]  Final Clinical Impressions(s) / ED Diagnoses     ICD-10-CM   1. Allergic reaction, initial encounter  T78.40XA   2. Urticaria  L50.9     ED Discharge Orders    None       Discharge Instructions Discussed with and Provided to Patient:     Discharge Instructions     You were evaluated in the Emergency Department and after careful evaluation, we did not find any emergent condition requiring admission or further testing in the hospital.  Your exam/testing today was overall reassuring.  Your symptoms seem to be due to an allergic reaction.  Please take over-the-counter Benadryl as needed for your itching.  Please return to the Emergency Department if you experience any worsening of your condition.  We encourage you to follow up with a primary care provider.  Thank you for allowing [place] to be a part of your care.        Sabas Sous, MD 05/05/19 1013

## 2019-05-05 NOTE — ED Triage Notes (Signed)
Pt reports around the time she attemped to go to bed tonight, she started having a diffuse itchy rash to her face, arms and torso. She states rash began around 2am, she reports hx of allergy to grapefruit but denies any known contact with allergen. Denies difficulty breathing or oral swelling. Has not taken anything for the rash. resp e/u nad.

## 2019-05-05 NOTE — Discharge Instructions (Signed)
You were evaluated in the Emergency Department and after careful evaluation, we did not find any emergent condition requiring admission or further testing in the hospital.  Your exam/testing today was overall reassuring.  Your symptoms seem to be due to an allergic reaction.  Please take over-the-counter Benadryl as needed for your itching.  Please return to the Emergency Department if you experience any worsening of your condition.  We encourage you to follow up with a primary care provider.  Thank you for allowing [place] to be a part of your care.

## 2019-05-05 NOTE — ED Notes (Signed)
Pt came up to tech asking if there was anything we can give her to stop the itching she has. This tech informed pt that she needs to be seen by a provider before we can give her anything.

## 2019-05-06 ENCOUNTER — Ambulatory Visit (HOSPITAL_COMMUNITY)
Admission: EM | Admit: 2019-05-06 | Discharge: 2019-05-06 | Disposition: A | Discharge status: Home / Self Care | Payer: Medicaid Other | Attending: Family Medicine | Admitting: Family Medicine

## 2019-05-06 ENCOUNTER — Other Ambulatory Visit: Payer: Self-pay

## 2019-05-06 ENCOUNTER — Encounter (HOSPITAL_COMMUNITY): Payer: Self-pay

## 2019-05-06 DIAGNOSIS — L509 Urticaria, unspecified: Secondary | ICD-10-CM

## 2019-05-06 MED ORDER — METHYLPREDNISOLONE SODIUM SUCC 125 MG IJ SOLR
125.0000 mg | Freq: Once | INTRAMUSCULAR | Status: AC
  Administered 2019-05-06: 125 mg via INTRAMUSCULAR

## 2019-05-06 MED ORDER — METHYLPREDNISOLONE SODIUM SUCC 125 MG IJ SOLR
INTRAMUSCULAR | Status: AC
  Filled 2019-05-06: qty 2

## 2019-05-06 MED ORDER — TRIAMCINOLONE ACETONIDE 0.1 % EX CREA: 1.0000 "application " | TOPICAL_CREAM | Freq: Three times a day (TID) | CUTANEOUS | 0 refills | Status: DC

## 2019-05-06 MED ORDER — PREDNISONE 20 MG PO TABS: ORAL_TABLET | ORAL | 0 refills | Status: DC

## 2019-05-06 NOTE — Discharge Instructions (Addendum)
Take medication as prescribed. Apply steroid cream up to 3 times daily as needed while hives are present.

## 2019-05-06 NOTE — ED Provider Notes (Signed)
[facility]    CSN: [id number] Arrival date & time: 05/06/19  1447      History   Chief Complaint Chief Complaint  Patient presents with   Rash    HPI Sonia Sanders is a 19 y.o. female.   HPI  Patient presents for evaluation of rash that has been present all over her body x3 days.  She was seen at the ER 2 days ago but reports she did not receive a prescription for oral prednisone.  She has been using symptoms at home with Benadryl however has noticed the rash is extended all over her body although endorses when she received the prednisone shot at the ER the rash was present on her face resolved.  She denies any known contact with an irritant or consuming a new food or changes to cosmetics.  She denies that this is ever happened previously.  Past Medical History:  Diagnosis Date   Depression     There are no problems to display for this patient.   History reviewed. No pertinent surgical history.  OB History   No obstetric history on file.      Home Medications    Prior to Admission medications   Medication Sig Start Date End Date Taking? Authorizing Provider  erythromycin ophthalmic ointment Place a 1/2 inch ribbon of ointment into the lower eyelid 4-6 times daily x 1 week Patient not taking: Reported on 05/06/2019 02/08/19   Lew Dawes, PA-C    Family History Family History  Problem Relation Age of Onset   Crohn's disease Mother    Healthy Father     Social History Social History   Tobacco Use   Smoking status: Light Tobacco Smoker    Types: Cigars   Smokeless tobacco: Never Used   Tobacco comment: occ. smokes black and milds  Substance Use Topics   Alcohol use: Yes   Drug use: Yes    Types: Marijuana    Allergies   Grapefruit extract  Review of Systems Review of Systems Pertinent negatives listed in HPI Physical Exam Triage Vital Signs ED Triage Vitals  Enc Vitals Group     BP 05/06/19 1520 103/72     Pulse  Rate 05/06/19 1520 77     Resp 05/06/19 1520 16     Temp 05/06/19 1520 97.9 F (36.6 C)     Temp Source 05/06/19 1520 Oral     SpO2 05/06/19 1520 100 %     Weight 05/06/19 1518 113 lb (51.3 kg)     Height --      Head Circumference --      Peak Flow --      Pain Score --      Pain Loc --      Pain Edu? --      Excl. in GC? --    No data found.  Updated Vital Signs BP 103/72 (BP Location: Right Arm)    Pulse 77    Temp 97.9 F (36.6 C) (Oral)    Resp 16    Wt 113 lb (51.3 kg)    LMP 04/28/2019    SpO2 100%    BMI 20.02 kg/m   Visual Acuity Right Eye Distance:   Left Eye Distance:   Bilateral Distance:    Right Eye Near:   Left Eye Near:    Bilateral Near:     Physical Exam HENT:     Head: Normocephalic.     Nose: Nose normal.  Cardiovascular:     Rate and Rhythm: Normal rate and regular rhythm.  Pulmonary:     Effort: Pulmonary effort is normal.     Breath sounds: Normal breath sounds.  Musculoskeletal:     Cervical back: Normal range of motion.  Skin:    Findings: Rash present. Rash is urticarial.     Comments: Generalized rash   Neurological:     Mental Status: She is alert.  Psychiatric:        Mood and Affect: Mood normal.        Behavior: Behavior normal.    UC Treatments / Results  Labs (all labs ordered are listed, but only abnormal results are displayed) Labs Reviewed - No data to display  EKG   Radiology No results found.  Procedures Procedures (including critical care time)  Medications Ordered in UC Medications - No data to display  Initial Impression / Assessment and Plan / UC Course  I have reviewed the triage vital signs and the nursing notes.  Pertinent labs & imaging results that were available during my care of the patient were reviewed by me and considered in my medical decision making (see chart for details).    1. Hives 2. Urticaria Hives and urticaria of unknown etiology.  Solumedrol IM administered here in  clinic. Take Prednisone 20 mg as follows:Take 3 pills for 3 days, Take 2 pills for 3 days, and Take 1 pill for 3 days.  Start Triamcinolone Cream apply affected area TID. Continue Benadryl for itching.  Final Clinical Impressions(s) / UC Diagnoses   Final diagnoses:  Hives  Urticaria     Discharge Instructions     Take medication as prescribed. Apply steroid cream up to 3 times daily as needed while hives are present.    ED Prescriptions    Medication Sig Dispense Auth. Provider   predniSONE (DELTASONE) 20 MG tablet Take 3 PO QAM x3days, 2 PO QAM x3days, 1 PO QAM x3days 18 tablet Scot Jun, FNP   triamcinolone cream (KENALOG) 0.1 % Apply 1 application topically 3 (three) times daily. 454 g Scot Jun, FNP     PDMP not reviewed this encounter.   Scot Jun, Ashley 05/06/19 [phone]

## 2019-05-06 NOTE — ED Triage Notes (Signed)
Pt states she has rash all over x 3 days. Pt states she went to the ER on 2 days ago. Pt states she has been itching. Marland Kitchen

## 2019-06-10 ENCOUNTER — Encounter (HOSPITAL_COMMUNITY): Payer: Self-pay

## 2019-06-10 ENCOUNTER — Other Ambulatory Visit: Payer: Self-pay

## 2019-06-10 ENCOUNTER — Ambulatory Visit (HOSPITAL_COMMUNITY)
Admission: EM | Admit: 2019-06-10 | Discharge: 2019-06-10 | Disposition: A | Discharge status: Home / Self Care | Payer: Medicaid Other

## 2019-06-10 DIAGNOSIS — R1010 Upper abdominal pain, unspecified: Secondary | ICD-10-CM | POA: Diagnosis not present

## 2019-06-10 DIAGNOSIS — R112 Nausea with vomiting, unspecified: Secondary | ICD-10-CM

## 2019-06-10 HISTORY — DX: Other seasonal allergic rhinitis: J30.2

## 2019-06-10 MED ORDER — ONDANSETRON 4 MG PO TBDP
4.0000 mg | ORAL_TABLET | Freq: Once | ORAL | Status: AC
  Administered 2019-06-10: 4 mg via ORAL

## 2019-06-10 MED ORDER — ONDANSETRON HCL 4 MG PO TABS: 4.0000 mg | ORAL_TABLET | Freq: Four times a day (QID) | ORAL | 0 refills | Status: DC

## 2019-06-10 MED ORDER — ONDANSETRON HCL 4 MG/2ML IJ SOLN: 4.0000 mg | Freq: Once | INTRAMUSCULAR | Status: DC

## 2019-06-10 MED ORDER — ONDANSETRON 4 MG PO TBDP
ORAL_TABLET | ORAL | Status: AC
  Filled 2019-06-10: qty 1

## 2019-06-10 NOTE — ED Triage Notes (Signed)
Pt c/o vomiting that started this morning upon waking. Pt states she vomited 4 times today so far. Pt denies any other symptoms.

## 2019-06-10 NOTE — ED Provider Notes (Signed)
[facility]   [id number] 06/10/19 Arrival Time: 1341  CC: ABDOMINAL PAIN  SUBJECTIVE:  Sonia Sanders is a 19 y.o. female who presents with complaint of abdominal discomfort that began abruptly this morning at 2am. 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 Could not quantify the amount when asked. Localizes pain to epigastrum. Reports that she is nauseated and has been vomiting. Describes as intermittent and achy in character. Has not tried OTC medications. Denies alleviating or aggravating factors. Reports similar symptoms in the past with excessive alcohol intake.  Denies fever, chills, appetite changes, weight changes, chest pain, SOB, diarrhea, constipation, hematochezia, melena, dysuria, difficulty urinating, increased frequency or urgency, flank pain, loss of bowel or bladder function, vaginal discharge, vaginal odor, vaginal bleeding, pelvic pain.     No LMP recorded.  ROS: As per HPI.  All other pertinent ROS negative.     Past Medical History:  Diagnosis Date  . Depression   . Seasonal allergies    History reviewed. No pertinent surgical history. Allergies  Allergen Reactions  . Grapefruit Extract Anaphylaxis, Shortness Of Breath, Swelling and Rash    THROAT AND TONGUE SWELL!!   No current facility-administered medications on file prior to encounter.   Current Outpatient Medications on File Prior to Encounter  Medication Sig Dispense Refill  . cetirizine (ZYRTEC) 10 MG tablet Take 10 mg by mouth daily.    Marland Kitchen erythromycin ophthalmic ointment Place a 1/2 inch ribbon of ointment into the lower eyelid 4-6 times daily x 1 week (Patient not taking: Reported on 05/06/2019) 3.5 g 0  . predniSONE (DELTASONE) 20 MG tablet Take 3 PO QAM x3days, 2 PO QAM x3days, 1 PO QAM x3days 18 tablet 0  . triamcinolone cream (KENALOG) 0.1 % Apply 1 application topically 3 (three) times daily. 454 g 0   Social History   Socioeconomic History  .  Marital status: Single    Spouse name: Not on file  . Number of children: Not on file  . Years of education: Not on file  . Highest education level: Not on file  Occupational History  . Not on file  Tobacco Use  . Smoking status: Light Tobacco Smoker    Types: Cigars  . Smokeless tobacco: Never Used  . Tobacco comment: occ. smokes black and milds  Substance and Sexual Activity  . Alcohol use: Yes    Comment: liquor every weekend  . Drug use: Yes    Types: Marijuana  . Sexual activity: Not on file  Other Topics Concern  . Not on file  Social History Narrative  . Not on file   Social Determinants of Health   Financial Resource Strain:   . Difficulty of Paying Living Expenses:   Food Insecurity:   . Worried About Programme researcher, broadcasting/film/video in the Last Year:   . Barista in the Last Year:   Transportation Needs:   . Freight forwarder (Medical):   Marland Kitchen Lack of Transportation (Non-Medical):   Physical Activity:   . Days of Exercise per Week:   . Minutes of Exercise per Session:   Stress:   . Feeling of Stress :   Social Connections:   . Frequency of Communication with Friends and Family:   . Frequency of Social Gatherings with Friends and Family:   . Attends Religious Services:   . Active Member of Clubs or Organizations:   . Attends Banker Meetings:   .  Marital Status:   Intimate Partner Violence:   . Fear of Current or Ex-Partner:   . Emotionally Abused:   Marland Kitchen Physically Abused:   . Sexually Abused:    Family History  Problem Relation Age of Onset  . Crohn's disease Mother   . Healthy Father      OBJECTIVE:  Vitals:   06/10/19 1353 06/10/19 1354  BP: 112/70   Pulse: (!) 42   Resp: 18   Temp: 98.1 F (36.7 C)   TempSrc: Oral   SpO2: 100%   Weight:  108 lb (49 kg)  Height:  5\' 3"  (1.6 m)    General appearance: Alert; NAD HEENT: NCAT.  Oropharynx clear.  Lungs: clear to auscultation bilaterally without adventitious breath sounds Heart:  regular rate and rhythm.  Radial pulses 2+ symmetrical bilaterally Abdomen: soft, non-distended; normal active bowel sounds; mildly tender at epigastrum; nontender at McBurney's point; negative Murphy's sign; negative rebound; no guarding Back: no CVA tenderness Extremities: no edema; symmetrical with no gross deformities Skin: warm and dry Neurologic: normal gait Psychological: alert and cooperative; normal mood and affect  LABS: No results found for this or any previous visit (from the past 24 hour(s)).  DIAGNOSTIC STUDIES: No results found.   ASSESSMENT & PLAN:  1. Non-intractable vomiting with nausea, unspecified vomiting type   2. Pain of upper abdomen     Meds ordered this encounter  Medications  . DISCONTD: ondansetron (ZOFRAN) injection 4 mg  . ondansetron (ZOFRAN-ODT) disintegrating tablet 4 mg  . ondansetron (ZOFRAN) 4 MG tablet    Sig: Take 1 tablet (4 mg total) by mouth every 6 (six) hours.    Dispense:  12 tablet    Refill:  0    Order Specific Question:   Supervising Provider    Answer:   Chase Picket [id number]    Abd Pain Nausea and vomiting Zofran in office Get rest and drink fluids Zofran prescribed.   Take as directed.    DIET Instructions:  30 minutes after taking nausea medicine, begin with sips of clear liquids. If able to hold down 2 - 4 ounces for 30 minutes, begin drinking more. Increase your fluid intake to replace losses. Clear liquids only for 24 hours (water, tea, sport drinks, clear flat ginger ale or cola and juices, broth, jello, popsicles, ect). Advance to bland foods, applesauce, rice, baked or boiled chicken, ect. Avoid milk, greasy foods and anything that doesn't agree with you.  If you experience new or worsening symptoms return or go to ER such as fever, chills, nausea, vomiting, diarrhea, bloody or dark tarry stools, constipation, urinary symptoms, worsening abdominal discomfort, symptoms that do not improve with medications,  inability to keep fluids down.  Reviewed expectations re: course of current medical issues. Questions answered. Outlined signs and symptoms indicating need for more acute intervention. Patient verbalized understanding. After Visit Summary given.    Faustino Congress, NP 06/10/19 1555

## 2019-06-10 NOTE — Discharge Instructions (Signed)
You have received Zofran in the office to help your nausea  I have sent in a prescription for Zofran to your pharmacy as well  Drink fluids with electrolytes like gatorade or pedialyte  Follow up with this office or with primary care as needed

## 2019-06-18 ENCOUNTER — Ambulatory Visit (HOSPITAL_COMMUNITY)
Admission: EM | Admit: 2019-06-18 | Discharge: 2019-06-18 | Disposition: A | Discharge status: Home / Self Care | Payer: Medicaid Other | Attending: Family Medicine | Admitting: Family Medicine

## 2019-06-18 ENCOUNTER — Other Ambulatory Visit: Payer: Self-pay

## 2019-06-18 ENCOUNTER — Encounter (HOSPITAL_COMMUNITY): Payer: Self-pay

## 2019-06-18 DIAGNOSIS — H6123 Impacted cerumen, bilateral: Secondary | ICD-10-CM

## 2019-06-18 NOTE — ED Provider Notes (Signed)
[facility]    CSN: [id number] Arrival date & time: 06/18/19  1946      History   Chief Complaint Chief Complaint  Patient presents with  . clogged ears    HPI Sonia Sanders is a 19 y.o. female.   HPI  Patient states both ears feel blocked.  Her hearing is diminished.  No pain.  No pressure.  No sinus drainage or cough or cold symptoms.  No allergies.  No prior ear problems  Past Medical History:  Diagnosis Date  . Depression   . Seasonal allergies     There are no problems to display for this patient.   History reviewed. No pertinent surgical history.  OB History   No obstetric history on file.      Home Medications    Prior to Admission medications   Medication Sig Start Date End Date Taking? Authorizing Provider  cetirizine (ZYRTEC) 10 MG tablet Take 10 mg by mouth daily.    [provider]  erythromycin ophthalmic ointment Place a 1/2 inch ribbon of ointment into the lower eyelid 4-6 times daily x 1 week Patient not taking: Reported on 05/06/2019 02/08/19   Wieters, Hallie C, PA-C  ondansetron (ZOFRAN) 4 MG tablet Take 1 tablet (4 mg total) by mouth every 6 (six) hours. 06/10/19   Faustino Congress, NP  predniSONE (DELTASONE) 20 MG tablet Take 3 PO QAM x3days, 2 PO QAM x3days, 1 PO QAM x3days 05/06/19   Scot Jun, FNP  triamcinolone cream (KENALOG) 0.1 % Apply 1 application topically 3 (three) times daily. 05/06/19   Scot Jun, FNP    Family History Family History  Problem Relation Age of Onset  . Crohn's disease Mother   . Healthy Father     Social History Social History   Tobacco Use  . Smoking status: Light Tobacco Smoker    Types: Cigars  . Smokeless tobacco: Never Used  . Tobacco comment: occ. smokes black and milds  Vaping Use  . Vaping Use: Some days  Substance Use Topics  . Alcohol use: Yes    Comment: liquor every weekend  . Drug use: Yes    Types: Marijuana     Allergies   Grapefruit  extract   Review of Systems Review of Systems  HENT: Positive for hearing loss.      Physical Exam Triage Vital Signs ED Triage Vitals  Enc Vitals Group     BP 06/18/19 2023 113/62     Pulse Rate 06/18/19 2023 75     Resp 06/18/19 2023 16     Temp 06/18/19 2023 98 F (36.7 C)     Temp Source 06/18/19 2023 Oral     SpO2 06/18/19 2023 100 %     Weight 06/18/19 2024 108 lb (49 kg)     Height 06/18/19 2024 5\' 3"  (1.6 m)     Head Circumference --      Peak Flow --      Pain Score 06/18/19 2023 5     Pain Loc --      Pain Edu? --      Excl. in [place]? --    No data found.  Updated Vital Signs BP 113/62   Pulse 75   Temp 98 F (36.7 C) (Oral)   Resp 16   Ht 5\' 3"  (1.6 m)   Wt 49 kg   SpO2 100%   BMI 19.13 kg/m   Visual Acuity Right Eye Distance:  Left Eye Distance:   Bilateral Distance:    Right Eye Near:   Left Eye Near:    Bilateral Near:     Physical Exam Constitutional:      General: She is not in acute distress.    Appearance: She is well-developed.  HENT:     Head: Normocephalic and atraumatic.     Right Ear: Tympanic membrane normal. There is impacted cerumen.     Left Ear: Tympanic membrane normal. There is impacted cerumen.     Nose: Nose normal.     Mouth/Throat:     Mouth: Mucous membranes are moist.     Pharynx: No posterior oropharyngeal erythema.  Eyes:     Conjunctiva/sclera: Conjunctivae normal.     Pupils: Pupils are equal, round, and reactive to light.  Cardiovascular:     Rate and Rhythm: Normal rate.  Pulmonary:     Effort: Pulmonary effort is normal. No respiratory distress.  Musculoskeletal:        General: Normal range of motion.     Cervical back: Normal range of motion.  Skin:    General: Skin is warm and dry.  Neurological:     Mental Status: She is alert.  Psychiatric:        Mood and Affect: Mood normal.        Behavior: Behavior normal.      UC Treatments / Results  Labs (all labs ordered are listed, but only  abnormal results are displayed) Labs Reviewed - No data to display  EKG   Radiology No results found.  Procedures Procedures (including critical care time)  Medications Ordered in UC Medications - No data to display  Initial Impression / Assessment and Plan / UC Course  I have reviewed the triage vital signs and the nursing notes.  Pertinent labs & imaging results that were available during my care of the patient were reviewed by me and considered in my medical decision making (see chart for details).     Ears cleaned and TMs are clear Final Clinical Impressions(s) / UC Diagnoses   Final diagnoses:  Hearing loss of both ears due to cerumen impaction  Bilateral impacted cerumen     Discharge Instructions     Return as needed    ED Prescriptions    None     PDMP not reviewed this encounter.   Eustace Moore, MD 06/18/19 [id number]
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
## 2019-06-18 NOTE — Discharge Instructions (Signed)
Return as needed

## 2019-06-26 ENCOUNTER — Emergency Department (HOSPITAL_COMMUNITY)
Admission: EM | Admit: 2019-06-26 | Discharge: 2019-06-26 | Disposition: A | Discharge status: Home / Self Care | Payer: Medicaid Other | Attending: Emergency Medicine | Admitting: Emergency Medicine

## 2019-06-26 ENCOUNTER — Encounter (HOSPITAL_COMMUNITY): Payer: Self-pay | Admitting: Emergency Medicine

## 2019-06-26 ENCOUNTER — Emergency Department (HOSPITAL_COMMUNITY): Payer: Medicaid Other

## 2019-06-26 DIAGNOSIS — M79601 Pain in right arm: Secondary | ICD-10-CM

## 2019-06-26 DIAGNOSIS — M25531 Pain in right wrist: Secondary | ICD-10-CM | POA: Diagnosis not present

## 2019-06-26 DIAGNOSIS — M79621 Pain in right upper arm: Secondary | ICD-10-CM | POA: Insufficient documentation

## 2019-06-26 DIAGNOSIS — Z720 Tobacco use: Secondary | ICD-10-CM | POA: Insufficient documentation

## 2019-06-26 MED ORDER — NAPROXEN 375 MG PO TABS: 375.0000 mg | ORAL_TABLET | Freq: Two times a day (BID) | ORAL | 0 refills | Status: DC
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
## 2019-06-26 NOTE — Discharge Instructions (Signed)
Your x-rays were negative for any abnormality.  Please follow-up with orthopedics if he continued to have pain.  Take anti-inflammatories as prescribed.  Take these with food and make sure to drink plenty of water.  You may apply ice to the affected area up to 5 times a day on top of a towel.  Return for any new or worsening symptoms.
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
## 2019-08-04 ENCOUNTER — Ambulatory Visit: Payer: Medicaid Other | Admitting: Advanced Practice Midwife

## 2019-09-23 ENCOUNTER — Ambulatory Visit (HOSPITAL_COMMUNITY)
Admission: EM | Admit: 2019-09-23 | Discharge: 2019-09-23 | Disposition: A | Discharge status: Home / Self Care | Payer: Medicaid Other | Attending: Urgent Care | Admitting: Urgent Care

## 2019-09-23 ENCOUNTER — Other Ambulatory Visit: Payer: Self-pay

## 2019-09-23 ENCOUNTER — Encounter (HOSPITAL_COMMUNITY): Payer: Self-pay

## 2019-09-23 DIAGNOSIS — K0889 Other specified disorders of teeth and supporting structures: Secondary | ICD-10-CM

## 2019-09-23 DIAGNOSIS — K047 Periapical abscess without sinus: Secondary | ICD-10-CM

## 2019-09-23 MED ORDER — AMOXICILLIN 875 MG PO TABS
875.0000 mg | ORAL_TABLET | Freq: Two times a day (BID) | ORAL | 0 refills | Status: DC
Start: 2019-09-23 — End: 2021-01-25

## 2019-09-23 MED ORDER — NAPROXEN 500 MG PO TABS
500.0000 mg | ORAL_TABLET | Freq: Two times a day (BID) | ORAL | 0 refills | Status: DC
Start: 2019-09-23 — End: 2019-10-24

## 2019-09-23 NOTE — ED Provider Notes (Signed)
[facility]   MRN: [id number] DOB: Mar 05, 2000  Subjective:   Sonia Sanders is a 19 y.o. female presenting for 2-day history of cute onset moderate to severe constant left lower sided dental pain.  Patient states that she had an episode of nausea vomiting but was from breathing and noxious fumes at work.  She has not had recurrence.  States that she has not had to go to the dentist regularly tries to practice good consistent dental hygiene.  Denies fever, runny or stuffy nose, sore throat, cough, facial swelling, facial pain.  No current facility-administered medications for this encounter.  Current Outpatient Medications:  .  cetirizine (ZYRTEC) 10 MG tablet, Take 10 mg by mouth daily., Disp: , Rfl:  .  erythromycin ophthalmic ointment, Place a 1/2 inch ribbon of ointment into the lower eyelid 4-6 times daily x 1 week (Patient not taking: Reported on 05/06/2019), Disp: 3.5 g, Rfl: 0 .  naproxen (NAPROSYN) 375 MG tablet, Take 1 tablet (375 mg total) by mouth 2 (two) times daily., Disp: 20 tablet, Rfl: 0 .  ondansetron (ZOFRAN) 4 MG tablet, Take 1 tablet (4 mg total) by mouth every 6 (six) hours., Disp: 12 tablet, Rfl: 0 .  predniSONE (DELTASONE) 20 MG tablet, Take 3 PO QAM x3days, 2 PO QAM x3days, 1 PO QAM x3days, Disp: 18 tablet, Rfl: 0 .  triamcinolone cream (KENALOG) 0.1 %, Apply 1 application topically 3 (three) times daily., Disp: 454 g, Rfl: 0   Allergies  Allergen Reactions  . Grapefruit Extract Anaphylaxis, Shortness Of Breath, Swelling and Rash    THROAT AND TONGUE SWELL!!    Past Medical History:  Diagnosis Date  . Depression   . Seasonal allergies      History reviewed. No pertinent surgical history.  Family History  Problem Relation Age of Onset  . Crohn's disease Mother   . Healthy Father     Social History   Tobacco Use  . Smoking status: Light Tobacco Smoker    Types: Cigars  . Smokeless tobacco: Never Used  . Tobacco comment: occ.  smokes black and milds  Vaping Use  . Vaping Use: Some days  Substance Use Topics  . Alcohol use: Yes    Comment: liquor every weekend  . Drug use: Yes    Types: Marijuana    ROS   Objective:   Vitals: BP 106/67 (BP Location: Right Arm)   Pulse 95   Temp 98.3 F (36.8 C) (Oral)   Resp 20   LMP 08/31/2019   SpO2 100%   Physical Exam Constitutional:      General: She is not in acute distress.    Appearance: Normal appearance. She is well-developed. She is not ill-appearing, toxic-appearing or diaphoretic.  HENT:     Head: Normocephalic and atraumatic.      Nose: Nose normal.     Mouth/Throat:     Mouth: Mucous membranes are moist.     Pharynx: Oropharynx is clear.   Eyes:     General: No scleral icterus.    Extraocular Movements: Extraocular movements intact.     Pupils: Pupils are equal, round, and reactive to light.  Cardiovascular:     Rate and Rhythm: Normal rate.  Pulmonary:     Effort: Pulmonary effort is normal.  Skin:    General: Skin is warm and dry.  Neurological:     General: No focal deficit present.     Mental Status: She is alert and oriented  to person, place, and time.  Psychiatric:        Mood and Affect: Mood normal.        Behavior: Behavior normal.     Assessment and Plan :   PDMP not reviewed this encounter.  1. Pain, dental   2. Dental infection     Start amoxicillin for dental infection/abscess, use naproxen for pain and inflammation. Emphasized need for dental surgeon consult. Counseled patient on potential for adverse effects with medications prescribed/recommended today, strict ER and return-to-clinic precautions discussed, patient verbalized understanding.    Wallis Bamberg, PA-C 09/23/19 1024

## 2019-09-23 NOTE — Discharge Instructions (Signed)
Make sure you schedule an appointment with a dentist/dental surgeon as soon as possible.  You may try some of the resources below.     [facility] [phone] extension [phone]  Dr. Civils [phone] [address]  [facility] [phone] [address]  [facility] [phone] extension [phone] [place], [place], [address] First come first serve for the first 10 clients.  May do simple extractions only, no wisdom teeth or surgery.  You may try the second for Thursday of the month starting at 6:30 AM.  [employer] You may call the school to see if they are still helping to provide dental care for emergent cases.  

## 2019-09-23 NOTE — ED Triage Notes (Signed)
Pt presents with dental pain X 2 days and vomiting today after an unusual smell.

## 2019-10-23 ENCOUNTER — Other Ambulatory Visit: Payer: Self-pay

## 2019-10-23 ENCOUNTER — Emergency Department (HOSPITAL_COMMUNITY)
Admission: EM | Admit: 2019-10-23 | Discharge: 2019-10-23 | Disposition: A | Discharge status: Home / Self Care | Payer: Medicaid Other | Attending: Emergency Medicine | Admitting: Emergency Medicine

## 2019-10-23 ENCOUNTER — Emergency Department (HOSPITAL_COMMUNITY): Payer: Medicaid Other

## 2019-10-23 ENCOUNTER — Encounter (HOSPITAL_COMMUNITY): Payer: Self-pay | Admitting: Emergency Medicine

## 2019-10-23 DIAGNOSIS — M79671 Pain in right foot: Secondary | ICD-10-CM | POA: Insufficient documentation

## 2019-10-23 DIAGNOSIS — R519 Headache, unspecified: Secondary | ICD-10-CM | POA: Diagnosis not present

## 2019-10-23 DIAGNOSIS — Z5321 Procedure and treatment not carried out due to patient leaving prior to being seen by health care provider: Secondary | ICD-10-CM | POA: Insufficient documentation

## 2019-10-23 DIAGNOSIS — Y92410 Unspecified street and highway as the place of occurrence of the external cause: Secondary | ICD-10-CM | POA: Insufficient documentation

## 2019-10-23 DIAGNOSIS — R079 Chest pain, unspecified: Secondary | ICD-10-CM | POA: Diagnosis not present

## 2019-10-23 DIAGNOSIS — Y9389 Activity, other specified: Secondary | ICD-10-CM | POA: Insufficient documentation
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
## 2019-10-24 ENCOUNTER — Encounter (HOSPITAL_COMMUNITY): Payer: Self-pay

## 2019-10-24 ENCOUNTER — Other Ambulatory Visit: Payer: Self-pay

## 2019-10-24 ENCOUNTER — Emergency Department (HOSPITAL_COMMUNITY)
Admission: EM | Admit: 2019-10-24 | Discharge: 2019-10-24 | Disposition: A | Discharge status: Home / Self Care | Payer: Medicaid Other | Attending: Emergency Medicine | Admitting: Emergency Medicine

## 2019-10-24 DIAGNOSIS — R0789 Other chest pain: Secondary | ICD-10-CM | POA: Diagnosis not present

## 2019-10-24 DIAGNOSIS — M79674 Pain in right toe(s): Secondary | ICD-10-CM

## 2019-10-24 DIAGNOSIS — Y92481 Parking lot as the place of occurrence of the external cause: Secondary | ICD-10-CM | POA: Insufficient documentation

## 2019-10-24 DIAGNOSIS — M542 Cervicalgia: Secondary | ICD-10-CM

## 2019-10-24 DIAGNOSIS — F17290 Nicotine dependence, other tobacco product, uncomplicated: Secondary | ICD-10-CM | POA: Diagnosis not present

## 2019-10-24 MED ORDER — NAPROXEN 500 MG PO TABS
500.0000 mg | ORAL_TABLET | Freq: Two times a day (BID) | ORAL | 0 refills | Status: DC
Start: 2019-10-24 — End: 2021-01-25

## 2019-10-24 MED ORDER — NAPROXEN 500 MG PO TABS
500.0000 mg | ORAL_TABLET | Freq: Once | ORAL | Status: AC
  Administered 2019-10-24: 500 mg via ORAL
  Filled 2019-10-24: qty 1

## 2019-10-24 MED ORDER — LIDOCAINE 5 % EX PTCH
1.0000 | MEDICATED_PATCH | Freq: Once | CUTANEOUS | Status: DC
  Administered 2019-10-24: 1 via TRANSDERMAL
  Filled 2019-10-24: qty 1

## 2019-10-24 MED ORDER — METHOCARBAMOL 500 MG PO TABS
500.0000 mg | ORAL_TABLET | Freq: Two times a day (BID) | ORAL | 0 refills | Status: DC
Start: 2019-10-24 — End: 2021-01-25
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
## 2019-10-24 NOTE — Discharge Instructions (Signed)

## 2019-11-23 ENCOUNTER — Ambulatory Visit (HOSPITAL_COMMUNITY)
Admission: EM | Admit: 2019-11-23 | Discharge: 2019-11-23 | Disposition: A | Discharge status: Home / Self Care | Payer: Medicaid Other | Attending: Family Medicine | Admitting: Family Medicine

## 2019-11-23 ENCOUNTER — Encounter (HOSPITAL_COMMUNITY): Payer: Self-pay | Admitting: Emergency Medicine

## 2019-11-23 ENCOUNTER — Other Ambulatory Visit: Payer: Self-pay

## 2019-11-23 DIAGNOSIS — R1030 Lower abdominal pain, unspecified: Secondary | ICD-10-CM

## 2019-11-23 DIAGNOSIS — N946 Dysmenorrhea, unspecified: Secondary | ICD-10-CM

## 2019-11-23 DIAGNOSIS — R112 Nausea with vomiting, unspecified: Secondary | ICD-10-CM

## 2019-11-23 LAB — POCT URINALYSIS DIPSTICK, ED / UC
Bilirubin Urine: NEGATIVE
Glucose, UA: NEGATIVE mg/dL
Ketones, ur: NEGATIVE mg/dL
Leukocytes,Ua: NEGATIVE
Nitrite: NEGATIVE
Protein, ur: NEGATIVE mg/dL
Specific Gravity, Urine: >=1.03 (ref 1.005–1.030)
Urobilinogen, UA: 0.2 mg/dL (ref 0.0–1.0)
pH: 5 (ref 5.0–8.0)

## 2019-11-23 LAB — POC URINE PREG, ED: Preg Test, Ur: NEGATIVE

## 2019-11-23 MED ORDER — ONDANSETRON 4 MG PO TBDP
4.0000 mg | ORAL_TABLET | Freq: Once | ORAL | Status: AC
  Administered 2019-11-23: 4 mg via ORAL

## 2019-11-23 MED ORDER — ONDANSETRON 4 MG PO TBDP: 4.0000 mg | ORAL_TABLET | Freq: Three times a day (TID) | ORAL | 0 refills | Status: DC | PRN

## 2019-11-23 MED ORDER — DIPHENHYDRAMINE HCL 50 MG/ML IJ SOLN
INTRAMUSCULAR | Status: AC
  Filled 2019-11-23: qty 1

## 2019-11-23 MED ORDER — DIPHENHYDRAMINE HCL 50 MG/ML IJ SOLN
50.0000 mg | Freq: Once | INTRAMUSCULAR | Status: AC
  Administered 2019-11-23: 50 mg via INTRAMUSCULAR

## 2019-11-23 MED ORDER — KETOROLAC TROMETHAMINE 60 MG/2ML IM SOLN
60.0000 mg | Freq: Once | INTRAMUSCULAR | Status: AC
  Administered 2019-11-23: 60 mg via INTRAMUSCULAR

## 2019-11-23 MED ORDER — ONDANSETRON 4 MG PO TBDP
ORAL_TABLET | ORAL | Status: AC
  Filled 2019-11-23: qty 1

## 2019-11-23 MED ORDER — KETOROLAC TROMETHAMINE 60 MG/2ML IM SOLN
INTRAMUSCULAR | Status: AC
  Filled 2019-11-23: qty 2

## 2019-11-23 NOTE — ED Triage Notes (Signed)
Pt presents with lower abdominal pain, back pain, nausea and vomiting. States on the 1st day of menstrual cycle this always happens.

## 2019-11-23 NOTE — Discharge Instructions (Addendum)
Please do your best to ensure adequate fluid intake in order to avoid dehydration. If you find that you are unable to tolerate drinking fluids regularly please proceed to the Emergency Department for evaluation.  Also, you should return to the hospital if you experience persistent fevers for greater than 1-2 more days, increasing abdominal pain that persists despite medications, persistent diarrhea, dizziness, syncope (fainting), or for any other concerns you may find worrisome.  

## 2019-11-23 NOTE — ED Provider Notes (Signed)
d [facility]   [id number] 11/23/19 Arrival Time: 1339  ASSESSMENT & PLAN:  1. Intractable vomiting with nausea, unspecified vomiting type   2. Lower abdominal pain   3. Severe menstrual cramps     Benign exam. No indications for urgent abdominal/pelvic imaging at this time. Discussed. Recommend GYN f/u.  Given: Meds ordered this encounter  Medications  . ondansetron (ZOFRAN-ODT) disintegrating tablet 4 mg  . ketorolac (TORADOL) injection 60 mg  . ondansetron (ZOFRAN-ODT) disintegrating tablet 4 mg  . diphenhydrAMINE (BENADRYL) injection 50 mg  . ondansetron (ZOFRAN-ODT) 4 MG disintegrating tablet    Sig: Take 1 tablet (4 mg total) by mouth every 8 (eight) hours as needed for nausea or vomiting.    Dispense:  15 tablet    Refill:  0     Discharge Instructions     Please do your best to ensure adequate fluid intake in order to avoid dehydration. If you find that you are unable to tolerate drinking fluids regularly please proceed to the Emergency Department for evaluation.  Also, you should return to the hospital if you experience persistent fevers for greater than 1-2 more days, increasing abdominal pain that persists despite medications, persistent diarrhea, dizziness, syncope (fainting), or for any other concerns you may find worrisome.     Follow-up Information    [facility] EMERGENCY DEPARTMENT.   Specialty: Emergency Medicine Why: If symptoms worsen in any way. Contact information: [address] [id number] [place] [address] [phone]               Reviewed expectations re: course of current medical issues. Questions answered. Outlined signs and symptoms indicating need for more acute intervention. Patient verbalized understanding. After Visit Summary given.   SUBJECTIVE: History from: patient. Sonia Sanders is a 19 y.o. female who presents with complaint of persistent lower abdominal  discomfort. Onset gradual, today. "Started my period." Discomfort described as cramping; without radiation. Symptoms are unchanged since beginning. Fever: absent. Aggravating factors: have not been identified. Alleviating factors: have not been identified. She denies none. Appetite: normal. PO intake: decreased. Ambulatory without assistance. Urinary symptoms: none. Bowel movements: have not significantly changed. No vaginal discharge. History of similar: yes (reports same symptoms with menstrual period every month; x 1 day then she begins feeling better).  Patient's last menstrual period was 11/23/2019.   History reviewed. No pertinent surgical history.   OBJECTIVE:  Vitals:   11/23/19 1441  BP: 109/79  Pulse: 61  Resp: 17  Temp: 97.7 F (36.5 C)  TempSrc: Oral  SpO2: 100%    General appearance: alert, oriented, no acute distress but appears uncomfortable; vomiting at times Lungs: unlabored respirations Abdomen: soft; without distention; moderate and poorly localized tenderness to palpation over lower abdomen; normal bowel sounds; without masses or organomegaly; without guarding or rebound tenderness Back: without reported CVA tenderness; FROM at waist Extremities: without LE edema; symmetrical; without gross deformities Skin: warm and dry Neurologic: normal gait Psychological: alert and cooperative; normal mood and affect  Labs: Results for orders placed or performed during the hospital encounter of 11/23/19  POC Urinalysis dipstick  Result Value Ref Range   Glucose, UA NEGATIVE NEGATIVE mg/dL   Bilirubin Urine NEGATIVE NEGATIVE   Ketones, ur NEGATIVE NEGATIVE mg/dL   Specific Gravity, Urine >=1.030 1.005 - 1.030   Hgb urine dipstick LARGE (A) NEGATIVE   pH 5.0 5.0 - 8.0   Protein, ur NEGATIVE NEGATIVE mg/dL   Urobilinogen, UA 0.2 0.0 -  1.0 mg/dL   Nitrite NEGATIVE NEGATIVE   Leukocytes,Ua NEGATIVE NEGATIVE  POC urine pregnancy  Result Value Ref Range   Preg Test, Ur  NEGATIVE NEGATIVE     Allergies  Allergen Reactions  . Grapefruit Extract Anaphylaxis, Shortness Of Breath, Swelling and Rash    THROAT AND TONGUE SWELL!!                                               Past Medical History:  Diagnosis Date  . Depression   . Seasonal allergies     Social History   Socioeconomic History  . Marital status: Single    Spouse name: Not on file  . Number of children: Not on file  . Years of education: Not on file  . Highest education level: Not on file  Occupational History  . Not on file  Tobacco Use  . Smoking status: Light Tobacco Smoker    Types: Cigars  . Smokeless tobacco: Never Used  . Tobacco comment: occ. smokes black and milds  Vaping Use  . Vaping Use: Some days  Substance and Sexual Activity  . Alcohol use: Yes    Comment: liquor every weekend  . Drug use: Yes    Types: Marijuana  . Sexual activity: Not on file  Other Topics Concern  . Not on file  Social History Narrative  . Not on file   Social Determinants of Health   Financial Resource Strain:   . Difficulty of Paying Living Expenses: Not on file  Food Insecurity:   . Worried About Programme researcher, broadcasting/film/video in the Last Year: Not on file  . Ran Out of Food in the Last Year: Not on file  Transportation Needs:   . Lack of Transportation (Medical): Not on file  . Lack of Transportation (Non-Medical): Not on file  Physical Activity:   . Days of Exercise per Week: Not on file  . Minutes of Exercise per Session: Not on file  Stress:   . Feeling of Stress : Not on file  Social Connections:   . Frequency of Communication with Friends and Family: Not on file  . Frequency of Social Gatherings with Friends and Family: Not on file  . Attends Religious Services: Not on file  . Active Member of Clubs or Organizations: Not on file  . Attends Banker Meetings: Not on file  . Marital Status: Not on file  Intimate Partner Violence:   . Fear of Current or Ex-Partner: Not  on file  . Emotionally Abused: Not on file  . Physically Abused: Not on file  . Sexually Abused: Not on file    Family History  Problem Relation Age of Onset  . Crohn's disease Mother   . Healthy Father      Mardella Layman, MD 11/24/19 [phone]

## 2020-04-22 ENCOUNTER — Emergency Department (HOSPITAL_COMMUNITY): Payer: No Typology Code available for payment source

## 2020-04-22 ENCOUNTER — Other Ambulatory Visit: Payer: Self-pay

## 2020-04-22 ENCOUNTER — Emergency Department (HOSPITAL_COMMUNITY)
Admission: EM | Admit: 2020-04-22 | Discharge: 2020-04-22 | Disposition: A | Discharge status: Home / Self Care | Payer: No Typology Code available for payment source | Attending: Emergency Medicine | Admitting: Emergency Medicine

## 2020-04-22 ENCOUNTER — Encounter (HOSPITAL_COMMUNITY): Payer: Self-pay | Admitting: Emergency Medicine

## 2020-04-22 DIAGNOSIS — Y92410 Unspecified street and highway as the place of occurrence of the external cause: Secondary | ICD-10-CM | POA: Diagnosis not present

## 2020-04-22 DIAGNOSIS — S0990XA Unspecified injury of head, initial encounter: Secondary | ICD-10-CM | POA: Diagnosis not present

## 2020-04-22 DIAGNOSIS — F17290 Nicotine dependence, other tobacco product, uncomplicated: Secondary | ICD-10-CM | POA: Insufficient documentation

## 2020-04-22 DIAGNOSIS — S0093XA Contusion of unspecified part of head, initial encounter: Secondary | ICD-10-CM

## 2020-04-22 MED ORDER — LORAZEPAM 1 MG PO TABS
0.5000 mg | ORAL_TABLET | Freq: Once | ORAL | Status: AC
  Administered 2020-04-22: 0.5 mg via ORAL
  Filled 2020-04-22: qty 1
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
## 2020-05-09 ENCOUNTER — Ambulatory Visit (HOSPITAL_COMMUNITY)
Admission: EM | Admit: 2020-05-09 | Discharge: 2020-05-09 | Disposition: A | Discharge status: Home / Self Care | Payer: Medicaid Other

## 2020-05-09 ENCOUNTER — Other Ambulatory Visit: Payer: Self-pay

## 2020-05-09 NOTE — ED Notes (Signed)
Pt called, no answer. Called pt on phone, states that she left.

## 2020-05-09 NOTE — ED Notes (Signed)
Pt not in waiting room x 1 

## 2020-11-27 ENCOUNTER — Other Ambulatory Visit: Payer: Self-pay

## 2020-11-27 ENCOUNTER — Ambulatory Visit (HOSPITAL_COMMUNITY)
Admission: EM | Admit: 2020-11-27 | Discharge: 2020-11-27 | Disposition: A | Discharge status: Home / Self Care | Payer: Medicaid Other | Attending: Sports Medicine | Admitting: Sports Medicine

## 2020-11-27 ENCOUNTER — Encounter (HOSPITAL_COMMUNITY): Payer: Self-pay | Admitting: *Deleted

## 2020-11-27 DIAGNOSIS — M791 Myalgia, unspecified site: Secondary | ICD-10-CM

## 2020-11-27 DIAGNOSIS — R5383 Other fatigue: Secondary | ICD-10-CM

## 2020-11-27 DIAGNOSIS — J0100 Acute maxillary sinusitis, unspecified: Secondary | ICD-10-CM | POA: Diagnosis not present

## 2020-11-27 DIAGNOSIS — R52 Pain, unspecified: Secondary | ICD-10-CM

## 2020-11-27 LAB — POC INFLUENZA A AND B ANTIGEN (URGENT CARE ONLY)
INFLUENZA A ANTIGEN, POC: NEGATIVE
INFLUENZA B ANTIGEN, POC: NEGATIVE

## 2020-11-27 LAB — POCT INFECTIOUS MONO SCREEN, ED / UC: Mono Screen: NEGATIVE

## 2020-11-27 MED ORDER — AMOXICILLIN 875 MG PO TABS: 875.0000 mg | ORAL_TABLET | Freq: Two times a day (BID) | ORAL | 0 refills | Status: AC

## 2020-11-27 NOTE — Discharge Instructions (Addendum)
Sonia Sanders,  You tested negative for influenza and for mono.  This is less likely COVID, although this is still a consideration.  Would recommend you take an at home test.  We are treating you for sinusitis, I sent in a prescription for amoxicillin that you will take twice a day for 10 days.  You can begin this tonight. Make sure you are getting plenty of rest.  Drinking lots of fluid and eating a well-balanced diet.

## 2020-11-27 NOTE — ED Provider Notes (Addendum)
[place]    CSN: [id number] Arrival date & time: 11/27/20  1523      History   Chief Complaint Chief Complaint  Patient presents with   Generalized Body Aches   Facial Pain    HPI Sonia Sanders is a 20 y.o. female who presents with generalized body aches, facial pain and malaise x 1 day .   HPI  Patient's symptoms started earlier this morning upon awakening.  She does endorse chills and body aches.  She also has a facial pain in the frontal and maxillary region of her face.  She has had a slightly stuffy nose.  She does have a light tickle in the back of her throat, although no significant pharyngitis.  She denies any cough.  She does have generalized body aches and fatigue.  Her appetite preserved.  She denies any known sick contacts.  She denies any chest pain or shortness of breath.  No abdominal pain, nausea or diarrhea.  History reviewed. No pertinent past medical history.  There are no problems to display for this patient.   History reviewed. No pertinent surgical history.  OB History   No obstetric history on file.      Home Medications    Prior to Admission medications   Medication Sig Start Date End Date Taking? Authorizing Provider  amoxicillin (AMOXIL) 875 MG tablet Take 1 tablet (875 mg total) by mouth 2 (two) times daily for 10 days. 11/27/20 12/07/20 Yes Elba Barman, DO    Family History History reviewed. No pertinent family history.  Social History Social History   Tobacco Use   Smoking status: Every Day   Smokeless tobacco: Never  Substance Use Topics   Alcohol use: Yes   Drug use: Yes    Types: Marijuana     Allergies   Patient has no known allergies.   Review of Systems Review of Systems  Constitutional:  Positive for chills and fatigue. Negative for fever.  HENT:  Positive for congestion and sinus pressure. Negative for rhinorrhea.   Respiratory:  Negative for cough and shortness of breath.   Cardiovascular:   Negative for chest pain.  Gastrointestinal:  Negative for abdominal pain, diarrhea and vomiting.  Genitourinary:  Negative for dysuria.  Musculoskeletal:  Positive for myalgias. Negative for neck stiffness.  Skin:  Negative for rash.  Neurological:  Positive for headaches. Negative for dizziness and weakness.    Physical Exam Triage Vital Signs ED Triage Vitals  Enc Vitals Group     BP 11/27/20 1614 108/62     Pulse Rate 11/27/20 1614 97     Resp 11/27/20 1614 18     Temp 11/27/20 1614 99.8 F (37.7 C)     Temp src --      SpO2 11/27/20 1614 100 %     Weight --      Height --      Head Circumference --      Peak Flow --      Pain Score 11/27/20 1611 9     Pain Loc --      Pain Edu? --      Excl. in [place]? --    No data found.  Updated Vital Signs BP 108/62   Pulse 97   Temp 99.8 F (37.7 C)   Resp 18   LMP 10/27/2020   SpO2 100%    Physical Exam Constitutional:      Appearance: She is ill-appearing. She is not toxic-appearing.  HENT:  Head: Normocephalic and atraumatic.     Comments: + maxillary fullness and TTP    Right Ear: Ear canal and external ear normal.     Left Ear: Ear canal and external ear normal.     Nose: Congestion present.     Mouth/Throat:     Mouth: Mucous membranes are moist.     Comments: 3+ tonsillar grading b/l, although not acutely erythematous, no exudate Eyes:     Extraocular Movements: Extraocular movements intact.     Pupils: Pupils are equal, round, and reactive to light.  Cardiovascular:     Rate and Rhythm: Normal rate.     Pulses: Normal pulses.     Heart sounds: Normal heart sounds.  Pulmonary:     Effort: Pulmonary effort is normal. No respiratory distress.     Breath sounds: No wheezing, rhonchi or rales.  Abdominal:     General: Abdomen is flat.     Palpations: Abdomen is soft.  Musculoskeletal:     Cervical back: Normal range of motion.  Lymphadenopathy:     Cervical: Cervical adenopathy present.  Skin:     General: Skin is warm.     Capillary Refill: Capillary refill takes less than 2 seconds.  Neurological:     Mental Status: She is alert.     UC Treatments / Results  Labs (all labs ordered are listed, but only abnormal results are displayed) Labs Reviewed  POC INFLUENZA A AND B ANTIGEN (URGENT CARE ONLY)  POCT INFECTIOUS MONO SCREEN, ED / UC    EKG   Radiology No results found.  Procedures Procedures (including critical care time)  Medications Ordered in UC Medications - No data to display  Initial Impression / Assessment and Plan / UC Course  I have reviewed the triage vital signs and the nursing notes.  Pertinent labs & imaging results that were available during my care of the patient were reviewed by me and considered in my medical decision making (see chart for details).     Acute maxillary sinusitis Myalgias and body aches Fatigue  Patient has had 1 day of ill symptoms as above.  She tested negative for influenza here at the urgent care.  Temperature 99.8 F and she is ill-appearing, however nontoxic appearing.  She does have a myriad of symptoms, although she does have sinus pressure and TTP that is suspicious for maxillary sinusitis.  I did discuss with her wanting to rule out mononucleosis, although she really does not have significant pharyngitis, although her tonsils are enlarged chronically. Rapid testing for mono (blood) was negative. Negative Influenza here at [facility] as well.  Given this, and her ill appearance, will send a prescription for amoxicillin 875 mg to be taken twice daily for 10 days with food.  Patient is agreeable and understanding to this plan.  In the meantime, she may take ibuprofen/Motrin or Tylenol for headache or irritability. Return precautions given. Final Clinical Impressions(s) / UC Diagnoses   Final diagnoses:  Acute maxillary sinusitis, recurrence not specified  Myalgia  Other fatigue  Body aches     Discharge Instructions       [place],  You tested negative for influenza and for mono.  This is less likely COVID, although this is still a consideration.  Would recommend you take an at home test.  We are treating you for sinusitis, I sent in a prescription for amoxicillin that you will take twice a day for 10 days.  You can begin this tonight. Make sure  you are getting plenty of rest.  Drinking lots of fluid and eating a well-balanced diet.     ED Prescriptions     Medication Sig Dispense Auth. Provider   amoxicillin (AMOXIL) 875 MG tablet Take 1 tablet (875 mg total) by mouth 2 (two) times daily for 10 days. 20 tablet Elba Barman, DO      PDMP not reviewed this encounter.   Elba Barman, DO 11/27/20 [place], [place], DO 11/27/20 Vernelle Emerald

## 2020-11-27 NOTE — ED Triage Notes (Signed)
Pt reports body aches and HA started this AM.

## 2021-01-25 ENCOUNTER — Other Ambulatory Visit: Payer: Self-pay

## 2021-01-25 ENCOUNTER — Ambulatory Visit (HOSPITAL_COMMUNITY)
Admission: EM | Admit: 2021-01-25 | Discharge: 2021-01-25 | Disposition: A | Discharge status: Home / Self Care | Payer: Medicaid Other | Attending: Family Medicine | Admitting: Family Medicine

## 2021-01-25 ENCOUNTER — Encounter (HOSPITAL_COMMUNITY): Payer: Self-pay | Admitting: Emergency Medicine

## 2021-01-25 DIAGNOSIS — R112 Nausea with vomiting, unspecified: Secondary | ICD-10-CM

## 2021-01-25 DIAGNOSIS — N946 Dysmenorrhea, unspecified: Secondary | ICD-10-CM

## 2021-01-25 DIAGNOSIS — R1084 Generalized abdominal pain: Secondary | ICD-10-CM

## 2021-01-25 MED ORDER — KETOROLAC TROMETHAMINE 60 MG/2ML IM SOLN
60.0000 mg | Freq: Once | INTRAMUSCULAR | Status: AC
  Administered 2021-01-25: 60 mg via INTRAMUSCULAR

## 2021-01-25 MED ORDER — KETOROLAC TROMETHAMINE 60 MG/2ML IM SOLN
INTRAMUSCULAR | Status: AC
  Filled 2021-01-25: qty 2

## 2021-01-25 MED ORDER — PROMETHAZINE HCL 25 MG PO TABS: 25.0000 mg | ORAL_TABLET | Freq: Four times a day (QID) | ORAL | 0 refills | Status: DC | PRN

## 2021-01-25 NOTE — ED Triage Notes (Signed)
Patient reports nausea, vomiting at onset of menstrual cycle, also associated with abdominal pain.  Patient reports she comes and gets shots of medicine to help with these issues

## 2021-01-25 NOTE — ED Notes (Signed)
Patient declined zofran odt

## 2021-01-25 NOTE — ED Provider Notes (Signed)
[place]    CSN: [id number] Arrival date & time: 01/25/21  1306      History   Chief Complaint Chief Complaint  Patient presents with   Oligomenorrhea    HPI Sonia Sanders is a 21 y.o. female.   She just started her period, and she gets severe nausea and abdominal pain.  Most months she can deal with it on her own, but some months she come here for medication.   In the past she could not tolerate the zofran, but the toradol.  She started with abd pain, n/v yesterday.  No otc meds taken as she has been vomiting.  She does not have an ob/gyn or PCP for this.   Past Medical History:  Diagnosis Date   Depression    Seasonal allergies     There are no problems to display for this patient.   History reviewed. No pertinent surgical history.  OB History   No obstetric history on file.      Home Medications    Prior to Admission medications   Medication Sig Start Date End Date Taking? Authorizing Provider  amoxicillin (AMOXIL) 875 MG tablet Take 1 tablet (875 mg total) by mouth 2 (two) times daily. Patient not taking: Reported on 01/25/2021 09/23/19   Jaynee Eagles, PA-C  cetirizine (ZYRTEC) 10 MG tablet Take 10 mg by mouth daily. Patient not taking: Reported on 01/25/2021    [provider]  erythromycin ophthalmic ointment Place a 1/2 inch ribbon of ointment into the lower eyelid 4-6 times daily x 1 week Patient not taking: Reported on 05/06/2019 02/08/19   Wieters, Hallie C, PA-C  methocarbamol (ROBAXIN) 500 MG tablet Take 1 tablet (500 mg total) by mouth 2 (two) times daily. Patient not taking: Reported on 01/25/2021 10/24/19   Jacqlyn Larsen, PA-C  naproxen (NAPROSYN) 500 MG tablet Take 1 tablet (500 mg total) by mouth 2 (two) times daily with a meal. 10/24/19   Jacqlyn Larsen, PA-C  ondansetron (ZOFRAN) 4 MG tablet Take 1 tablet (4 mg total) by mouth every 6 (six) hours. Patient not taking: Reported on 01/25/2021 06/10/19   Faustino Congress, NP   ondansetron (ZOFRAN-ODT) 4 MG disintegrating tablet Take 1 tablet (4 mg total) by mouth every 8 (eight) hours as needed for nausea or vomiting. Patient not taking: Reported on 01/25/2021 11/23/19   Vanessa Kick, MD  triamcinolone cream (KENALOG) 0.1 % Apply 1 application topically 3 (three) times daily. Patient not taking: Reported on 01/25/2021 05/06/19   Scot Jun, FNP    Family History Family History  Problem Relation Age of Onset   Crohn's disease Mother    Healthy Father     Social History Social History   Tobacco Use   Smoking status: Light Smoker    Types: Cigars   Smokeless tobacco: Never   Tobacco comments:    occ. smokes black and milds  Vaping Use   Vaping Use: Some days  Substance Use Topics   Alcohol use: Yes    Comment: liquor every weekend   Drug use: Yes    Types: Marijuana     Allergies   Grapefruit extract   Review of Systems Review of Systems  Constitutional:  Positive for appetite change.  HENT: Negative.    Respiratory: Negative.    Cardiovascular: Negative.   Gastrointestinal:  Positive for abdominal pain, nausea and vomiting.  Genitourinary: Negative.     Physical Exam Triage Vital Signs ED Triage Vitals  Enc  Vitals Group     BP 01/25/21 1353 121/81     Pulse Rate 01/25/21 1353 67     Resp 01/25/21 1353 18     Temp 01/25/21 1353 98.1 F (36.7 C)     Temp Source 01/25/21 1353 Oral     SpO2 01/25/21 1353 98 %     Weight --      Height --      Head Circumference --      Peak Flow --      Pain Score 01/25/21 1327 10     Pain Loc --      Pain Edu? --      Excl. in [place]? --    No data found.  Updated Vital Signs BP 121/81 (BP Location: Right Arm)    Pulse 67    Temp 98.1 F (36.7 C) (Oral)    Resp 18    LMP 01/25/2021    SpO2 98%   Visual Acuity Right Eye Distance:   Left Eye Distance:   Bilateral Distance:    Right Eye Near:   Left Eye Near:    Bilateral Near:     Physical Exam Constitutional:      Appearance:  Normal appearance. She is ill-appearing.  Cardiovascular:     Rate and Rhythm: Normal rate and regular rhythm.  Pulmonary:     Effort: Pulmonary effort is normal.     Breath sounds: Normal breath sounds.  Abdominal:     Palpations: Abdomen is soft.     Tenderness: There is abdominal tenderness. There is no rebound.  Musculoskeletal:     Cervical back: Normal range of motion and neck supple.  Neurological:     Mental Status: She is alert.     UC Treatments / Results  Labs (all labs ordered are listed, but only abnormal results are displayed) Labs Reviewed - No data to display  EKG   Radiology No results found.  Procedures Procedures (including critical care time)  Medications Ordered in UC Medications  ketorolac (TORADOL) injection 60 mg (60 mg Intramuscular Given 01/25/21 1426)    Initial Impression / Assessment and Plan / UC Course  I have reviewed the triage vital signs and the nursing notes.  Pertinent labs & imaging results that were available during my care of the patient were reviewed by me and considered in my medical decision making (see chart for details).    Final Clinical Impressions(s) / UC Diagnoses   Final diagnoses:  Generalized abdominal pain  Nausea and vomiting, unspecified vomiting type  Painful menstrual periods     Discharge Instructions      You were seen today for abdominal pain, nausea and vomiting.  You were given an injection of toradol today.  I have sent out a medication to help with the nausea to your pharmacy.  You may take motrin or tylenol for pain as well.  Please follow up with an ob/gyn or pcp for further management of you menstrual period issues.     ED Prescriptions     Medication Sig Dispense Auth. Provider   promethazine (PHENERGAN) 25 MG tablet Take 1 tablet (25 mg total) by mouth every 6 (six) hours as needed for nausea or vomiting. 10 tablet Rondel Oh, MD      PDMP not reviewed this encounter.   Rondel Oh, MD 01/25/21 1440

## 2021-01-25 NOTE — ED Notes (Signed)
Intake interrupted due to department emergency

## 2021-01-25 NOTE — Discharge Instructions (Addendum)
You were seen today for abdominal pain, nausea and vomiting.  You were given an injection of toradol today.  I have sent out a medication to help with the nausea to your pharmacy.  You may take motrin or tylenol for pain as well.  Please follow up with an ob/gyn or pcp for further management of you menstrual period issues.

## 2021-01-26 ENCOUNTER — Encounter (HOSPITAL_COMMUNITY): Payer: Self-pay | Admitting: *Deleted

## 2021-05-01 ENCOUNTER — Ambulatory Visit (HOSPITAL_COMMUNITY)
Admission: EM | Admit: 2021-05-01 | Discharge: 2021-05-01 | Disposition: A | Discharge status: Home / Self Care | Payer: Medicaid Other | Attending: Family Medicine | Admitting: Family Medicine

## 2021-05-01 ENCOUNTER — Encounter (HOSPITAL_COMMUNITY): Payer: Self-pay | Admitting: Emergency Medicine

## 2021-05-01 DIAGNOSIS — J029 Acute pharyngitis, unspecified: Secondary | ICD-10-CM | POA: Diagnosis not present

## 2021-05-01 DIAGNOSIS — L255 Unspecified contact dermatitis due to plants, except food: Secondary | ICD-10-CM | POA: Diagnosis not present

## 2021-05-01 LAB — POCT RAPID STREP A, ED / UC: Streptococcus, Group A Screen (Direct): NEGATIVE

## 2021-05-01 MED ORDER — TRIAMCINOLONE ACETONIDE 40 MG/ML IJ SUSP
40.0000 mg | Freq: Once | INTRAMUSCULAR | Status: AC
  Administered 2021-05-01: 40 mg via INTRAMUSCULAR

## 2021-05-01 MED ORDER — TRIAMCINOLONE ACETONIDE 40 MG/ML IJ SUSP
INTRAMUSCULAR | Status: AC
  Filled 2021-05-01: qty 1

## 2021-05-01 NOTE — ED Provider Notes (Signed)
?[facility] ? ? ? ?CSN: [id number] ?Arrival date & time: 05/01/21  0840 ? ? ?  ? ?History   ?Chief Complaint ?Chief Complaint  ?Patient presents with  ? Rash  ? Allergies  ? ? ?HPI ?Sonia Sanders is a 21 y.o. female.  ? ? ?Rash ?Here for rash that she noted yesterday.  It was itching when she first noticed it.  She did feel that she had probably contacted was some weeds and possible poison ivy or oak yesterday afternoon when out in the yard.  She is actually better today, but she is still itching on her arms. ? ?Also she has had some hoarseness and some sore throat today.  No fever or headache.  No nasal congestion.  She has maybe coughed a little bit from the feeling of congestion in her throat ? ?Past Medical History:  ?Diagnosis Date  ? Depression   ? Seasonal allergies   ? ? ?There are no problems to display for this patient. ? ? ?History reviewed. No pertinent surgical history. ? ?OB History   ?No obstetric history on file. ?  ? ? ? ?Home Medications   ? ?Prior to Admission medications   ?Not on File  ? ? ?Family History ?Family History  ?Problem Relation Age of Onset  ? Crohn's disease Mother   ? Healthy Father   ? ? ?Social History ?Social History  ? ?Tobacco Use  ? Smoking status: Light Smoker  ?  Types: Cigars  ? Smokeless tobacco: Never  ? Tobacco comments:  ?  occ. smokes black and milds  ?Vaping Use  ? Vaping Use: Some days  ?Substance Use Topics  ? Alcohol use: Yes  ?  Comment: liquor every weekend  ? Drug use: Yes  ?  Types: Marijuana  ? ? ? ?Allergies   ?Grapefruit extract ? ? ?Review of Systems ?Review of Systems  ?Skin:  Positive for rash.  ? ? ?Physical Exam ?Triage Vital Signs ?ED Triage Vitals  ?Enc Vitals Group  ?   BP 05/01/21 0919 109/60  ?   Pulse Rate 05/01/21 0919 67  ?   Resp 05/01/21 0919 16  ?   Temp 05/01/21 0919 97.9 ?F (36.6 ?C)  ?   Temp Source 05/01/21 0919 Oral  ?   SpO2 05/01/21 0919 97 %  ?   Weight 05/01/21 0918 115 lb 15.4 oz (52.6 kg)  ?   Height 05/01/21 0918  5\' 3"  (1.6 m)  ?   Head Circumference --   ?   Peak Flow --   ?   Pain Score 05/01/21 0918 0  ?   Pain Loc --   ?   Pain Edu? --   ?   Excl. in [place]? --   ? ?No data found. ? ?Updated Vital Signs ?BP 109/60 (BP Location: Left Arm)   Pulse 67   Temp 97.9 ?F (36.6 ?C) (Oral)   Resp 16   Ht 5\' 3"  (1.6 m)   Wt 52.6 kg   SpO2 97%   BMI 20.54 kg/m?  ? ?Visual Acuity ?Right Eye Distance:   ?Left Eye Distance:   ?Bilateral Distance:   ? ?Right Eye Near:   ?Left Eye Near:    ?Bilateral Near:    ? ?Physical Exam ?Vitals reviewed.  ?Constitutional:   ?   General: She is not in acute distress. ?   Appearance: She is not toxic-appearing.  ?HENT:  ?   Right Ear: Tympanic membrane and  ear canal normal.  ?   Left Ear: Tympanic membrane and ear canal normal.  ?   Nose: Nose normal.  ?   Mouth/Throat:  ?   Mouth: Mucous membranes are moist.  ?   Comments: There is mild erythema and 2+ tonsillar hypertrophy.  She has some white exudate in the tonsillar crypts. ?Eyes:  ?   Extraocular Movements: Extraocular movements intact.  ?   Conjunctiva/sclera: Conjunctivae normal.  ?   Pupils: Pupils are equal, round, and reactive to light.  ?Cardiovascular:  ?   Rate and Rhythm: Normal rate and regular rhythm.  ?   Heart sounds: No murmur heard. ?Pulmonary:  ?   Effort: Pulmonary effort is normal. No respiratory distress.  ?   Breath sounds: No wheezing, rhonchi or rales.  ?Musculoskeletal:  ?   Cervical back: Neck supple.  ?Lymphadenopathy:  ?   Cervical: No cervical adenopathy.  ?Skin: ?   Capillary Refill: Capillary refill takes less than 2 seconds.  ?   Coloration: Skin is not jaundiced or pale.  ?   Findings: No rash.  ?Neurological:  ?   General: No focal deficit present.  ?   Mental Status: She is alert and oriented to person, place, and time.  ?Psychiatric:     ?   Behavior: Behavior normal.  ? ? ? ?UC Treatments / Results  ?Labs ?(all labs ordered are listed, but only abnormal results are displayed) ?Labs Reviewed  ?CULTURE, GROUP  A STREP [facility])  ?POCT RAPID STREP A, ED / UC  ? ? ?EKG ? ? ?Radiology ?No results found. ? ?Procedures ?Procedures (including critical care time) ? ?Medications Ordered in UC ?Medications  ?triamcinolone acetonide (KENALOG-40) injection 40 mg (has no administration in time range)  ? ? ?Initial Impression / Assessment and Plan / UC Course  ?I have reviewed the triage vital signs and the nursing notes. ? ?Pertinent labs & imaging results that were available during my care of the patient were reviewed by me and considered in my medical decision making (see chart for details). ? ?  ? ?Rapid strep is negative, and throat culture is sent.  We will treat per protocol if positive.  Triamcinolone injection given for the possible contact dermatitis.  She requested the injection and not pills ?Final Clinical Impressions(s) / UC Diagnoses  ? ?Final diagnoses:  ?Contact dermatitis due to plants, except food, unspecified contact dermatitis type  ?Acute pharyngitis, unspecified etiology  ? ? ? ?Discharge Instructions   ? ?  ?Your rapid strep test was negative.  Throat culture is sent, and staff will call you if it is positive. ? ?You have been given an injection of triamcinolone 40 mg, which is a steroid for the rash and dermatitis. ? ?You can take Benadryl or Zyrtec as needed for itching ? ? ? ? ?ED Prescriptions   ?None ?  ? ?PDMP not reviewed this encounter. ?  ?Zenia Resides, MD ?05/01/21 1012 ? ?

## 2021-05-01 NOTE — Discharge Instructions (Addendum)
Your rapid strep test was negative.  Throat culture is sent, and staff will call you if it is positive. ? ?You have been given an injection of triamcinolone 40 mg, which is a steroid for the rash and dermatitis. ? ?You can take Benadryl or Zyrtec as needed for itching ?

## 2021-05-01 NOTE — ED Triage Notes (Signed)
Pt reports a touching poison ivy yesterday. States she is "itchy" all over and there was a rash on right shoulder.  ?Also reports wanting to get "sinuses checked" Denies sinus pressure.  ?

## 2021-05-03 LAB — CULTURE, GROUP A STREP (THRC)

## 2021-07-31 ENCOUNTER — Encounter (HOSPITAL_COMMUNITY): Payer: Self-pay

## 2021-07-31 ENCOUNTER — Ambulatory Visit (HOSPITAL_COMMUNITY)
Admission: EM | Admit: 2021-07-31 | Discharge: 2021-07-31 | Disposition: A | Discharge status: Home / Self Care | Payer: Medicaid Other | Attending: Physician Assistant | Admitting: Physician Assistant

## 2021-07-31 DIAGNOSIS — H60502 Unspecified acute noninfective otitis externa, left ear: Secondary | ICD-10-CM | POA: Diagnosis not present

## 2021-07-31 MED ORDER — OFLOXACIN 0.3 % OT SOLN: 5.0000 [drp] | Freq: Two times a day (BID) | OTIC | 0 refills | Status: DC

## 2021-07-31 NOTE — ED Provider Notes (Signed)
[facility]    CSN: [id number] Arrival date & time: 07/31/21  1528      History   Chief Complaint Chief Complaint  Patient presents with   Otalgia    HPI Sonia Sanders is a 21 y.o. female.   Patient presents today with a 2-day history of left otalgia.  Reports pain is rated 8 on a 0-10 pain scale, localized to left ear, described as throbbing, worse with palpation, no alleviating factors identified.  She denies any recent illness or additional symptoms including cough, congestion, fever, nausea, vomiting.  She denies any otorrhea.  She does not use earbuds, earplugs, Q-tips.  She does report going swimming recently and wonders if this could have contributed to symptoms.  She has tried over-the-counter Tylenol without improvement.  Denies any recent antibiotic use.  She denies history of diabetes or immunosuppression.  She denies any recent airplane travel or barotrauma.  She is having difficulty with daily duties as result of symptoms.    Past Medical History:  Diagnosis Date   Depression    Seasonal allergies     There are no problems to display for this patient.   History reviewed. No pertinent surgical history.  OB History   No obstetric history on file.      Home Medications    Prior to Admission medications   Medication Sig Start Date End Date Taking? Authorizing Provider  ofloxacin (FLOXIN) 0.3 % OTIC solution Place 5 drops into the left ear 2 (two) times daily. 07/31/21  Yes Benny Henrie, Noberto Retort, PA-C    Family History Family History  Problem Relation Age of Onset   Crohn's disease Mother    Healthy Father     Social History Social History   Tobacco Use   Smoking status: Light Smoker    Types: Cigars   Smokeless tobacco: Never   Tobacco comments:    occ. smokes black and milds  Vaping Use   Vaping Use: Some days  Substance Use Topics   Alcohol use: Yes    Comment: liquor every weekend   Drug use: Yes    Types: Marijuana      Allergies   Grapefruit extract   Review of Systems Review of Systems  Constitutional:  Positive for activity change. Negative for appetite change, fatigue and fever.  HENT:  Positive for ear pain and hearing loss. Negative for congestion, ear discharge, sinus pressure, sneezing and sore throat.   Respiratory:  Negative for cough and shortness of breath.   Cardiovascular:  Negative for chest pain.  Gastrointestinal:  Negative for abdominal pain, diarrhea, nausea and vomiting.  Neurological:  Negative for dizziness, light-headedness and headaches.     Physical Exam Triage Vital Signs ED Triage Vitals [07/31/21 1622]  Enc Vitals Group     BP 109/72     Pulse Rate 67     Resp 16     Temp 98.3 F (36.8 C)     Temp Source Oral     SpO2 99 %     Weight      Height      Head Circumference      Peak Flow      Pain Score 10     Pain Loc      Pain Edu?      Excl. in GC?    No data found.  Updated Vital Signs BP 109/72 (BP Location: Right Arm)   Pulse 67   Temp 98.3 F (36.8 C) (Oral)  Resp 16   LMP 06/28/2021 (Approximate)   SpO2 99%   Visual Acuity Right Eye Distance:   Left Eye Distance:   Bilateral Distance:    Right Eye Near:   Left Eye Near:    Bilateral Near:     Physical Exam Vitals reviewed.  Constitutional:      General: She is awake. She is not in acute distress.    Appearance: Normal appearance. She is well-developed. She is not ill-appearing.     Comments: Very pleasant female appears stated age no acute distress sitting comfortably in exam room  HENT:     Head: Normocephalic and atraumatic.     Right Ear: Tympanic membrane, ear canal and external ear normal. Tympanic membrane is not erythematous or bulging.     Left Ear: Tympanic membrane, ear canal and external ear normal. Swelling and tenderness present.     Ears:     Comments: Erythema and swelling noted external ear canal of left ear.  Pain with palpation of tragus and with manipulation  of external ear.  No drainage noted.  Approximately 40% of TM visible appears intact.    Nose:     Right Sinus: Maxillary sinus tenderness present. No frontal sinus tenderness.     Left Sinus: Maxillary sinus tenderness and frontal sinus tenderness present.     Mouth/Throat:     Pharynx: Uvula midline. No oropharyngeal exudate or posterior oropharyngeal erythema.  Cardiovascular:     Rate and Rhythm: Normal rate and regular rhythm.     Heart sounds: Normal heart sounds, S1 normal and S2 normal. No murmur heard. Pulmonary:     Effort: Pulmonary effort is normal.     Breath sounds: Normal breath sounds. No wheezing, rhonchi or rales.     Comments: Clear to auscultation bilaterally Abdominal:     General: Bowel sounds are normal.     Palpations: Abdomen is soft.     Tenderness: There is no abdominal tenderness.  Psychiatric:        Behavior: Behavior is cooperative.      UC Treatments / Results  Labs (all labs ordered are listed, but only abnormal results are displayed) Labs Reviewed - No data to display  EKG   Radiology No results found.  Procedures Procedures (including critical care time)  Medications Ordered in UC Medications - No data to display  Initial Impression / Assessment and Plan / UC Course  I have reviewed the triage vital signs and the nursing notes.  Pertinent labs & imaging results that were available during my care of the patient were reviewed by me and considered in my medical decision making (see chart for details).     Concern for otitis externa given clinical presentation.  We will start ofloxacin twice daily.  Recommend that she keep her ear facing upward several minutes after applying drops to allow complete penetration throughout ear canal.  Discussed that she should avoid placing anything in her ear or submerging her head underwater until symptoms resolve.  Can use over-the-counter medication including Tylenol and ibuprofen for pain relief.   Discussed that if she has any worsening symptoms she needs to be seen immediately.  Strict return precautions given.  Work excuse note provided.  Final Clinical Impressions(s) / UC Diagnoses   Final diagnoses:  Acute otitis externa of left ear, unspecified type     Discharge Instructions      Use drops twice daily for 10 days.  Keep your ear facing upward for a few  minutes after applying drops to ensure it goes throughout your ear canal.  Use Tylenol or Profen for pain.  Do not put anything in your ear including ear buds, earplugs, Q-tips until symptoms resolve.  Do not submerge your head in water.  If your symptoms are not improving please return.  If anything worsens you need to be seen immediately.     ED Prescriptions     Medication Sig Dispense Auth. Provider   ofloxacin (FLOXIN) 0.3 % OTIC solution Place 5 drops into the left ear 2 (two) times daily. 5 mL Onyx Schirmer K, PA-C      PDMP not reviewed this encounter.   Jeani Hawking, PA-C 07/31/21 1703

## 2021-07-31 NOTE — Discharge Instructions (Signed)
Use drops twice daily for 10 days.  Keep your ear facing upward for a few minutes after applying drops to ensure it goes throughout your ear canal.  Use Tylenol or Profen for pain.  Do not put anything in your ear including ear buds, earplugs, Q-tips until symptoms resolve.  Do not submerge your head in water.  If your symptoms are not improving please return.  If anything worsens you need to be seen immediately.

## 2021-07-31 NOTE — ED Triage Notes (Signed)
Pt states pain to  left ear with some difficulty hearing for the past 2 days.

## 2021-08-01 ENCOUNTER — Encounter (HOSPITAL_COMMUNITY): Payer: Self-pay | Admitting: Emergency Medicine

## 2021-08-01 ENCOUNTER — Ambulatory Visit (HOSPITAL_COMMUNITY)
Admission: EM | Admit: 2021-08-01 | Discharge: 2021-08-01 | Disposition: A | Discharge status: Home / Self Care | Payer: Medicaid Other | Attending: Emergency Medicine | Admitting: Emergency Medicine

## 2021-08-01 DIAGNOSIS — J353 Hypertrophy of tonsils with hypertrophy of adenoids: Secondary | ICD-10-CM

## 2021-08-01 DIAGNOSIS — H60501 Unspecified acute noninfective otitis externa, right ear: Secondary | ICD-10-CM | POA: Diagnosis present

## 2021-08-01 LAB — POCT RAPID STREP A, ED / UC: Streptococcus, Group A Screen (Direct): NEGATIVE

## 2021-08-01 NOTE — ED Triage Notes (Signed)
Pt reports here yesterday for left ear clogged. Prescribed drops for ear. Today right ear clogged and painful as well as throat.

## 2021-08-01 NOTE — ED Provider Notes (Signed)
[facility]    CSN: [id number] Arrival date & time: 08/01/21  1502      History   Chief Complaint Chief Complaint  Patient presents with   Otalgia    HPI Sonia Sanders is a 21 y.o. female.   Patient presents with right ear fullness and pain and a sore throat beginning today.  Began to have a nonproductive cough as well. has not attempted treatment of symptoms.  Tolerating food and liquids.  No known sick contacts.  Denies decreased hearing, ear drainage, pruritus, fever or chills, nasal congestion or rhinorrhea, shortness of breath and wheezing.  Was evaluated in the urgent care 1 day ago, diagnosed with left ear infection, has started taking ofloxacin but has not seen improvement in symptoms yet.  History of seasonal allergies.  Past Medical History:  Diagnosis Date   Depression    Seasonal allergies     There are no problems to display for this patient.   History reviewed. No pertinent surgical history.  OB History   No obstetric history on file.      Home Medications    Prior to Admission medications   Medication Sig Start Date End Date Taking? Authorizing Provider  ofloxacin (FLOXIN) 0.3 % OTIC solution Place 5 drops into the left ear 2 (two) times daily. 07/31/21   Raspet, Noberto Retort, PA-C    Family History Family History  Problem Relation Age of Onset   Crohn's disease Mother    Healthy Father     Social History Social History   Tobacco Use   Smoking status: Light Smoker    Types: Cigars   Smokeless tobacco: Never   Tobacco comments:    occ. smokes black and milds  Vaping Use   Vaping Use: Some days  Substance Use Topics   Alcohol use: Yes    Comment: liquor every weekend   Drug use: Yes    Types: Marijuana     Allergies   Grapefruit extract   Review of Systems Review of Systems  Constitutional: Negative.   HENT:  Positive for ear pain and sore throat. Negative for congestion, dental problem, drooling, ear discharge,  facial swelling, hearing loss, mouth sores, nosebleeds, postnasal drip, rhinorrhea, sinus pressure, sinus pain, sneezing, tinnitus, trouble swallowing and voice change.   Respiratory:  Positive for cough. Negative for apnea, choking, chest tightness, shortness of breath, wheezing and stridor.   Cardiovascular: Negative.   Gastrointestinal: Negative.   Skin: Negative.   Neurological: Negative.      Physical Exam Triage Vital Signs ED Triage Vitals  Enc Vitals Group     BP 08/01/21 1522 105/67     Pulse Rate 08/01/21 1522 65     Resp 08/01/21 1522 14     Temp 08/01/21 1522 98.7 F (37.1 C)     Temp Source 08/01/21 1522 Oral     SpO2 08/01/21 1522 98 %     Weight --      Height --      Head Circumference --      Peak Flow --      Pain Score 08/01/21 1520 10     Pain Loc --      Pain Edu? --      Excl. in GC? --    No data found.  Updated Vital Signs BP 105/67 (BP Location: Left Arm)   Pulse 65   Temp 98.7 F (37.1 C) (Oral)   Resp 14   LMP 06/28/2021 (Approximate)  SpO2 98%   Visual Acuity Right Eye Distance:   Left Eye Distance:   Bilateral Distance:    Right Eye Near:   Left Eye Near:    Bilateral Near:     Physical Exam Constitutional:      Appearance: Normal appearance.  HENT:     Head: Normocephalic.     Ears:     Comments: Mild to moderate swelling to the bilateral ear canals, no erythema or drainage noted, bilateral tympanic membranes are normal, tenderness is noted to the bilateral tragus and posterior to the ear along the posterior cervical lymph nodes    Nose: Nose normal.     Mouth/Throat:     Pharynx: Oropharynx is clear. Uvula midline. No posterior oropharyngeal erythema.     Tonsils: No tonsillar exudate. 2+ on the right. 2+ on the left.  Eyes:     Extraocular Movements: Extraocular movements intact.  Pulmonary:     Effort: Pulmonary effort is normal.  Neurological:     Mental Status: She is alert and oriented to person, place, and time.  Mental status is at baseline.  Psychiatric:        Mood and Affect: Mood normal.        Behavior: Behavior normal.      UC Treatments / Results  Labs (all labs ordered are listed, but only abnormal results are displayed) Labs Reviewed - No data to display  EKG   Radiology No results found.  Procedures Procedures (including critical care time)  Medications Ordered in UC Medications - No data to display  Initial Impression / Assessment and Plan / UC Course  I have reviewed the triage vital signs and the nursing notes.  Pertinent labs & imaging results that were available during my care of the patient were reviewed by me and considered in my medical decision making (see chart for details).  Acute otitis externa of right ear  On exam there is mild to moderate swelling to the bilateral canals with no change to the left ear in comparison to documentation 1 day ago, advised patient to begin use of ofloxacin into the right ear as well, ideally she will begin to see improvement in the next 48 hours of medication use, tonsillar adenopathy is noted without erythema or exudate, rapid strep test is negative and sent for culture, discussed findings with patient may use over-the-counter analgesics for management of discomfort as well as warm compresses to the external ear, recommended follow-up with urgent care if symptoms persist past use of medication Final Clinical Impressions(s) / UC Diagnoses   Final diagnoses:  None   Discharge Instructions   None    ED Prescriptions   None    PDMP not reviewed this encounter.   Valinda Hoar, NP 08/01/21 1622

## 2021-08-01 NOTE — Discharge Instructions (Addendum)
Today you are being treated for an infection of the ear canal, same as your left,   Your lymph nodes are swollen on exam without redness or Lyanne Kates patches, your strep test is negative for bacteria, your lymph nodes are most likely swollen due to the infections of your ear  May begin use of your ofloxacin eardrops on the right side as well placing 5 drops into the ear twice daily for 7 days  You may use Tylenol or ibuprofen for management of discomfort  May hold warm compresses to the ear for additional comfort  Please not attempted any ear cleaning or object or fluid placement into the ear canal to prevent further irritation

## 2021-08-02 ENCOUNTER — Encounter (HOSPITAL_BASED_OUTPATIENT_CLINIC_OR_DEPARTMENT_OTHER): Payer: Self-pay

## 2021-08-02 ENCOUNTER — Other Ambulatory Visit: Payer: Self-pay

## 2021-08-02 ENCOUNTER — Emergency Department (HOSPITAL_BASED_OUTPATIENT_CLINIC_OR_DEPARTMENT_OTHER)
Admission: EM | Admit: 2021-08-02 | Discharge: 2021-08-02 | Disposition: A | Discharge status: Home / Self Care | Payer: Medicaid Other | Attending: Emergency Medicine | Admitting: Emergency Medicine

## 2021-08-02 DIAGNOSIS — J029 Acute pharyngitis, unspecified: Secondary | ICD-10-CM | POA: Diagnosis not present

## 2021-08-02 DIAGNOSIS — R059 Cough, unspecified: Secondary | ICD-10-CM | POA: Diagnosis not present

## 2021-08-02 DIAGNOSIS — H9203 Otalgia, bilateral: Secondary | ICD-10-CM | POA: Insufficient documentation

## 2021-08-02 DIAGNOSIS — R070 Pain in throat: Secondary | ICD-10-CM | POA: Insufficient documentation

## 2021-08-02 DIAGNOSIS — Z20822 Contact with and (suspected) exposure to covid-19: Secondary | ICD-10-CM | POA: Insufficient documentation

## 2021-08-02 LAB — SARS CORONAVIRUS 2 BY RT PCR: SARS Coronavirus 2 by RT PCR: NEGATIVE

## 2021-08-02 LAB — GROUP A STREP BY PCR: Group A Strep by PCR: NOT DETECTED

## 2021-08-02 NOTE — ED Triage Notes (Signed)
Pt reports left ear pain since Friday has gone to Urgent care x 2. Bilateral ear pain now. Pt given drops. No relief. Woke up yesterday with throat pain

## 2021-08-02 NOTE — Discharge Instructions (Addendum)
You may continue to take Tylenol or ibuprofen as needed for aches and pain.  You may continue to apply ofloxacin eardrops to the left ear as previously prescribed.  If you develop fever, shortness of breath, or worsening symptoms you may return for further assessment.  A COVID test was obtained today, you may check the result through MyChart, link below

## 2021-08-02 NOTE — ED Provider Notes (Signed)
[facility] EMERGENCY DEPARTMENT Provider Note   CSN: [id number] Arrival date & time: 08/02/21  1052     History  Chief Complaint  Patient presents with   Sore Throat    Sonia Sanders is a 21 y.o. female.  The history is provided by the patient and medical records. No language interpreter was used.  Sore Throat    21 year old female presenting complaining of ear pain and throat pain.  Patient developed ear fullness and sore throat that began 2 days ago.  She also endorsed a nonproductive cough.  No recent sick contact.  No fever or chills.  Initially was seen at urgent care at the initial onset of his symptoms and was diagnosed with left infection and was prescribed ofloxacin.  She was seen in urgent care again yesterday with worsening of her symptoms.  She had a negative strep test and she was recommended to start taking ofloxacin.  Patient is here today with now worsening pain.  Home Medications Prior to Admission medications   Medication Sig Start Date End Date Taking? Authorizing Provider  ofloxacin (FLOXIN) 0.3 % OTIC solution Place 5 drops into the left ear 2 (two) times daily. 07/31/21  Yes Raspet, Erin K, PA-C      Allergies    Grapefruit extract    Review of Systems   Review of Systems  All other systems reviewed and are negative.   Physical Exam Updated Vital Signs BP 128/74 (BP Location: Left Arm)   Pulse 74   Temp 98.3 F (36.8 C) (Oral)   Resp 16   Ht 5\' 3"  (1.6 m)   Wt 49.9 kg   LMP 06/28/2021 (Approximate)   SpO2 99%   BMI 19.49 kg/m  Physical Exam Vitals and nursing note reviewed.  Constitutional:      General: She is not in acute distress.    Appearance: She is well-developed.  HENT:     Head: Atraumatic.     Right Ear: Tympanic membrane normal.     Ears:     Comments: Right TM with normal appearance and normal ear canal Left TM normal, ear canal mildly erythematous without any edema.    Mouth/Throat:     Mouth: Mucous  membranes are moist.     Pharynx: Oropharynx is clear. No oropharyngeal exudate or posterior oropharyngeal erythema.  Eyes:     Conjunctiva/sclera: Conjunctivae normal.  Cardiovascular:     Rate and Rhythm: Normal rate and regular rhythm.  Pulmonary:     Effort: Pulmonary effort is normal.  Abdominal:     Palpations: Abdomen is soft.     Tenderness: There is no abdominal tenderness.  Musculoskeletal:     Cervical back: Normal range of motion and neck supple. No rigidity or tenderness.  Skin:    Findings: No rash.  Neurological:     Mental Status: She is alert.  Psychiatric:        Mood and Affect: Mood normal.     ED Results / Procedures / Treatments   Labs (all labs ordered are listed, but only abnormal results are displayed) Labs Reviewed  GROUP A STREP BY PCR    EKG None  Radiology No results found.  Procedures Procedures    Medications Ordered in ED Medications - No data to display  ED Course/ Medical Decision Making/ A&P                           Medical Decision  Making  BP 128/74 (BP Location: Left Arm)   Pulse 74   Temp 98.3 F (36.8 C) (Oral)   Resp 16   Ht 5\' 3"  (1.6 m)   Wt 49.9 kg   LMP 06/28/2021 (Approximate)   SpO2 99%   BMI 19.49 kg/m   12:00 PM This is a generally healthy 21 year old female here with some cold symptoms.  On for the past 2 days she endorsed having pain in her ears left greater than right, having sore throat.  She does not endorse any fever or chills no trouble breathing no trouble swallowing except for discomfort no chest pain or shortness of breath or productive cough.  She has some nonproductive cough on occasion.  This is her third ER visit for her symptoms.  I have reviewed patient's previous notes and labs and considered my plan of care.  Patient initially was seen 2 days ago in the urgent care for her symptoms and was prescribed ofloxacin for possible left ear infection.  She was seen at urgent care yesterday and a  strep test was obtained which came back negative.  Patient was urged to take levofloxacin.  She is now being evaluated by me today.  She has some mild erythema to her left ear canal but otherwise no edema noted TM perforation or erythema.  Throat exam unremarkable no findings to suggest deep tissue infection such as peritonsillar abscess, pharyngitis, Ludwig angina or retropharyngeal abscess.  Heart and sounds normal.  Abdomen soft nontender.  She is afebrile, vitals are stable.  Recommend supportive care and return precaution given.  Suspect viral in etiology  Strep test obtained independently reviewed interpreted by me and is negative.  Will obtain a COVID test and patient can check the result through MyChart        Final Clinical Impression(s) / ED Diagnoses Final diagnoses:  Sore throat  Ear pain, bilateral    Rx / DC Orders ED Discharge Orders     None         30, PA-C 08/02/21 1207    Tegeler, 1208, MD 08/02/21 [phone]

## 2021-08-04 LAB — CULTURE, GROUP A STREP (THRC)

## 2021-08-28 ENCOUNTER — Ambulatory Visit (HOSPITAL_COMMUNITY)
Admission: EM | Admit: 2021-08-28 | Discharge: 2021-08-28 | Disposition: A | Discharge status: Home / Self Care | Payer: Medicaid Other | Attending: Family Medicine | Admitting: Family Medicine

## 2021-08-28 ENCOUNTER — Encounter (HOSPITAL_COMMUNITY): Payer: Self-pay | Admitting: Emergency Medicine

## 2021-08-28 DIAGNOSIS — N946 Dysmenorrhea, unspecified: Secondary | ICD-10-CM | POA: Diagnosis not present

## 2021-08-28 DIAGNOSIS — R112 Nausea with vomiting, unspecified: Secondary | ICD-10-CM

## 2021-08-28 MED ORDER — ONDANSETRON HCL 4 MG/2ML IJ SOLN
4.0000 mg | Freq: Once | INTRAMUSCULAR | Status: AC
  Administered 2021-08-28: 4 mg via INTRAMUSCULAR

## 2021-08-28 MED ORDER — ONDANSETRON HCL 4 MG/2ML IJ SOLN
INTRAMUSCULAR | Status: AC
  Filled 2021-08-28: qty 2

## 2021-08-28 MED ORDER — PROMETHAZINE HCL 25 MG PO TABS: 25.0000 mg | ORAL_TABLET | Freq: Four times a day (QID) | ORAL | 0 refills | Status: DC | PRN

## 2021-08-28 NOTE — ED Provider Notes (Signed)
[facility]    CSN: [id number] Arrival date & time: 08/28/21  1215      History   Chief Complaint Chief Complaint  Patient presents with   Emesis   Abdominal Pain    HPI Sonia Sanders is a 21 y.o. female.   Patient is here for n/v/abd cramping with her period.  This has happened almost every time she has her period.  She has used zofran in the past with help, but has not worked the last several times.  She would like a shot of zofran today if possible.    Past Medical History:  Diagnosis Date   Depression    Seasonal allergies     There are no problems to display for this patient.   History reviewed. No pertinent surgical history.  OB History   No obstetric history on file.      Home Medications    Prior to Admission medications   Medication Sig Start Date End Date Taking? Authorizing Provider  ofloxacin (FLOXIN) 0.3 % OTIC solution Place 5 drops into the left ear 2 (two) times daily. 07/31/21   Raspet, Noberto Retort, PA-C    Family History Family History  Problem Relation Age of Onset   Crohn's disease Mother    Healthy Father     Social History Social History   Tobacco Use   Smoking status: Light Smoker    Types: Cigars   Smokeless tobacco: Never   Tobacco comments:    occ. smokes black and milds  Vaping Use   Vaping Use: Some days  Substance Use Topics   Alcohol use: Yes    Comment: liquor every weekend   Drug use: Yes    Types: Marijuana     Allergies   Grapefruit extract   Review of Systems Review of Systems  Constitutional: Negative.   HENT: Negative.    Respiratory: Negative.    Cardiovascular: Negative.   Gastrointestinal:  Positive for abdominal pain and vomiting.  Genitourinary: Negative.   Musculoskeletal: Negative.      Physical Exam Triage Vital Signs ED Triage Vitals  Enc Vitals Group     BP 08/28/21 1226 113/65     Pulse Rate 08/28/21 1226 94     Resp 08/28/21 1226 15     Temp 08/28/21 1226 97.9 F  (36.6 C)     Temp src --      SpO2 08/28/21 1226 100 %     Weight --      Height --      Head Circumference --      Peak Flow --      Pain Score 08/28/21 1225 10     Pain Loc --      Pain Edu? --      Excl. in GC? --    No data found.  Updated Vital Signs BP 113/65   Pulse 94   Temp 97.9 F (36.6 C)   Resp 15   LMP 08/28/2021 (Approximate)   SpO2 100%   Visual Acuity Right Eye Distance:   Left Eye Distance:   Bilateral Distance:    Right Eye Near:   Left Eye Near:    Bilateral Near:     Physical Exam Constitutional:      Appearance: She is well-developed.  Cardiovascular:     Rate and Rhythm: Normal rate and regular rhythm.  Pulmonary:     Breath sounds: Normal breath sounds.  Abdominal:     General: Bowel sounds  are normal.     Palpations: Abdomen is soft.     Tenderness: There is generalized abdominal tenderness.  Skin:    General: Skin is warm.  Neurological:     General: No focal deficit present.     Mental Status: She is alert.  Psychiatric:        Mood and Affect: Mood normal.        Behavior: Behavior normal.      UC Treatments / Results  Labs (all labs ordered are listed, but only abnormal results are displayed) Labs Reviewed - No data to display  EKG   Radiology No results found.  Procedures Procedures (including critical care time)  Medications Ordered in UC Medications  ondansetron (ZOFRAN) injection 4 mg (has no administration in time range)    Initial Impression / Assessment and Plan / UC Course  I have reviewed the triage vital signs and the nursing notes.  Pertinent labs & imaging results that were available during my care of the patient were reviewed by me and considered in my medical decision making (see chart for details).    Final Clinical Impressions(s) / UC Diagnoses   Final diagnoses:  Dysmenorrhea  Nausea and vomiting, unspecified vomiting type     Discharge Instructions      You were seen today for  nausea and vomiting due to painful periods.  I have given you a shot of zofran today today help.  I have sent out a script for phenergan as well to see if helps at home.  Please follow up with your ob/gyn or primary care provider for further care if needed.     ED Prescriptions     Medication Sig Dispense Auth. Provider   promethazine (PHENERGAN) 25 MG tablet Take 1 tablet (25 mg total) by mouth every 6 (six) hours as needed for nausea or vomiting. 30 tablet Jannifer Franklin, MD      PDMP not reviewed this encounter.   Jannifer Franklin, MD 08/28/21 1346

## 2021-08-28 NOTE — Discharge Instructions (Signed)
You were seen today for nausea and vomiting due to painful periods.  I have given you a shot of zofran today today help.  I have sent out a script for phenergan as well to see if helps at home.  Please follow up with your ob/gyn or primary care provider for further care if needed.

## 2021-08-28 NOTE — ED Triage Notes (Signed)
Pt reports that started her menstrual cycle today. C/o abd cramping and n/v. Reports past two times had vomiting ODT Zofran got from [place] before isnt helping and would like a shot.

## 2021-11-13 ENCOUNTER — Ambulatory Visit (HOSPITAL_COMMUNITY)
Admission: EM | Admit: 2021-11-13 | Discharge: 2021-11-13 | Disposition: A | Discharge status: Home / Self Care | Payer: Medicaid Other | Attending: Internal Medicine | Admitting: Internal Medicine

## 2021-11-13 ENCOUNTER — Emergency Department (HOSPITAL_BASED_OUTPATIENT_CLINIC_OR_DEPARTMENT_OTHER)
Admission: EM | Admit: 2021-11-13 | Discharge: 2021-11-13 | Disposition: A | Discharge status: Home / Self Care | Payer: Medicaid Other | Attending: Emergency Medicine | Admitting: Emergency Medicine

## 2021-11-13 ENCOUNTER — Other Ambulatory Visit: Payer: Self-pay

## 2021-11-13 ENCOUNTER — Encounter (HOSPITAL_COMMUNITY): Payer: Self-pay | Admitting: *Deleted

## 2021-11-13 ENCOUNTER — Encounter (HOSPITAL_BASED_OUTPATIENT_CLINIC_OR_DEPARTMENT_OTHER): Payer: Self-pay | Admitting: Emergency Medicine

## 2021-11-13 DIAGNOSIS — R109 Unspecified abdominal pain: Secondary | ICD-10-CM | POA: Insufficient documentation

## 2021-11-13 DIAGNOSIS — R1084 Generalized abdominal pain: Secondary | ICD-10-CM

## 2021-11-13 DIAGNOSIS — R1115 Cyclical vomiting syndrome unrelated to migraine: Secondary | ICD-10-CM | POA: Diagnosis not present

## 2021-11-13 DIAGNOSIS — R197 Diarrhea, unspecified: Secondary | ICD-10-CM | POA: Diagnosis not present

## 2021-11-13 DIAGNOSIS — R112 Nausea with vomiting, unspecified: Secondary | ICD-10-CM

## 2021-11-13 LAB — PREGNANCY, URINE: Preg Test, Ur: NEGATIVE

## 2021-11-13 LAB — CBC WITH DIFFERENTIAL/PLATELET
Abs Immature Granulocytes: 0.06 10*3/uL (ref 0.00–0.07)
Basophils Absolute: 0 10*3/uL (ref 0.0–0.1)
Basophils Relative: 0 %
Eosinophils Absolute: 0 10*3/uL (ref 0.0–0.5)
Eosinophils Relative: 0 %
HCT: 35.2 % — ABNORMAL LOW (ref 36.0–46.0)
Hemoglobin: 11.9 g/dL — ABNORMAL LOW (ref 12.0–15.0)
Immature Granulocytes: 0 %
Lymphocytes Relative: 5 %
Lymphs Abs: 0.7 10*3/uL (ref 0.7–4.0)
MCH: 30.2 pg (ref 26.0–34.0)
MCHC: 33.8 g/dL (ref 30.0–36.0)
MCV: 89.3 fL (ref 80.0–100.0)
Monocytes Absolute: 0.5 10*3/uL (ref 0.1–1.0)
Monocytes Relative: 4 %
Neutro Abs: 13 10*3/uL — ABNORMAL HIGH (ref 1.7–7.7)
Neutrophils Relative %: 91 %
Platelets: 305 10*3/uL (ref 150–400)
RBC: 3.94 MIL/uL (ref 3.87–5.11)
RDW: 12.9 % (ref 11.5–15.5)
WBC: 14.3 10*3/uL — ABNORMAL HIGH (ref 4.0–10.5)
nRBC: 0 % (ref 0.0–0.2)

## 2021-11-13 LAB — RAPID URINE DRUG SCREEN, HOSP PERFORMED
Amphetamines: NOT DETECTED
Barbiturates: NOT DETECTED
Benzodiazepines: NOT DETECTED
Cocaine: NOT DETECTED
Opiates: NOT DETECTED
Tetrahydrocannabinol: POSITIVE — AB

## 2021-11-13 LAB — POCT URINALYSIS DIPSTICK, ED / UC
Glucose, UA: NEGATIVE mg/dL
Ketones, ur: >=160 mg/dL — AB
Leukocytes,Ua: NEGATIVE
Nitrite: NEGATIVE
Protein, ur: 100 mg/dL — AB
Specific Gravity, Urine: 1.025 (ref 1.005–1.030)
Urobilinogen, UA: 1 mg/dL (ref 0.0–1.0)
pH: 6.5 (ref 5.0–8.0)

## 2021-11-13 LAB — COMPREHENSIVE METABOLIC PANEL
ALT: 14 U/L (ref 0–44)
AST: 30 U/L (ref 15–41)
Albumin: 4.8 g/dL (ref 3.5–5.0)
Alkaline Phosphatase: 48 U/L (ref 38–126)
Anion gap: 13 (ref 5–15)
BUN: 14 mg/dL (ref 6–20)
CO2: 19 mmol/L — ABNORMAL LOW (ref 22–32)
Calcium: 9.8 mg/dL (ref 8.9–10.3)
Chloride: 106 mmol/L (ref 98–111)
Creatinine, Ser: 0.8 mg/dL (ref 0.44–1.00)
GFR, Estimated: >60 mL/min (ref >60)
Glucose, Bld: 121 mg/dL — ABNORMAL HIGH (ref 70–99)
Potassium: 3.3 mmol/L — ABNORMAL LOW (ref 3.5–5.1)
Sodium: 138 mmol/L (ref 135–145)
Total Bilirubin: 0.9 mg/dL (ref 0.3–1.2)
Total Protein: 8.6 g/dL — ABNORMAL HIGH (ref 6.5–8.1)

## 2021-11-13 LAB — URINALYSIS, MICROSCOPIC (REFLEX)

## 2021-11-13 LAB — URINALYSIS, ROUTINE W REFLEX MICROSCOPIC
Glucose, UA: NEGATIVE mg/dL
Hgb urine dipstick: NEGATIVE
Ketones, ur: 40 mg/dL — AB
Leukocytes,Ua: NEGATIVE
Nitrite: NEGATIVE
Protein, ur: 100 mg/dL — AB
Specific Gravity, Urine: >=1.03 (ref 1.005–1.030)
pH: 6 (ref 5.0–8.0)

## 2021-11-13 LAB — POC URINE PREG, ED: Preg Test, Ur: NEGATIVE

## 2021-11-13 LAB — LIPASE, BLOOD: Lipase: 24 U/L (ref 11–51)

## 2021-11-13 MED ORDER — ONDANSETRON HCL 4 MG/2ML IJ SOLN
INTRAMUSCULAR | Status: AC
  Filled 2021-11-13: qty 2

## 2021-11-13 MED ORDER — ONDANSETRON HCL 4 MG/2ML IJ SOLN
4.0000 mg | Freq: Once | INTRAMUSCULAR | Status: AC
  Administered 2021-11-13: 4 mg via INTRAMUSCULAR

## 2021-11-13 MED ORDER — ONDANSETRON 4 MG PO TBDP: ORAL_TABLET | ORAL | 0 refills | Status: DC

## 2021-11-13 NOTE — ED Notes (Signed)
Patient is being discharged from the Urgent Care and sent to the Emergency Department via POV with grandmother. Per Mare Ferrari, NP, patient is in need of higher level of care due to abdominal pain and vomiting. Patient is aware and verbalizes understanding of plan of care.  Vitals:   11/13/21 1542 11/13/21 1556  BP: 91/63   Pulse: 93 92  Resp:  20  Temp:  97.8 F (36.6 C)  SpO2: 100% 100%

## 2021-11-13 NOTE — ED Provider Notes (Signed)
[facility]    CSN: [id number] Arrival date & time: 11/13/21  1343      History   Chief Complaint Chief Complaint  Patient presents with   Abdominal Pain   Emesis    HPI Sonia Sanders is a 21 y.o. female.   Patient presents urgent care for evaluation of nausea, vomiting, and generalized abdominal pain that started a couple of days ago.  She recently started her menstrual cycle and states that this happens "every time she gets her period".  She has thrown up multiple times today and is currently dry heaving in the urgent care exam room.  Emesis is nonbloody/bilious.  Abdominal pain is to the generalized abdomen and currently a 10 on a scale of 0-10.  She is also currently having watery diarrhea without blood/mucus to the stools.  She smokes marijuana every day throughout the day but has not changed her smoking habits recently.  She does not smoke cigarettes.  Abdominal pain is causing patient to have a difficult time ambulating.  She reports increased urinary frequency without dysuria but does report urgency.  When provider walked into the room, patient reports that "she may have use the bathroom on herself".  She reports she is having a difficult time controlling her bowels due to vomiting.  She has not attempted use of any over-the-counter medications prior to arrival urgent care for her symptoms.   Abdominal Pain Associated symptoms: vomiting   Emesis Associated symptoms: abdominal pain     Past Medical History:  Diagnosis Date   Depression    Seasonal allergies     There are no problems to display for this patient.   History reviewed. No pertinent surgical history.  OB History   No obstetric history on file.      Home Medications    Prior to Admission medications   Medication Sig Start Date End Date Taking? Authorizing Provider  promethazine (PHENERGAN) 25 MG tablet Take 1 tablet (25 mg total) by mouth every 6 (six) hours as needed for nausea or  vomiting. 08/28/21   Jannifer Franklin, MD    Family History Family History  Problem Relation Age of Onset   Crohn's disease Mother    Healthy Father     Social History Social History   Tobacco Use   Smoking status: Light Smoker    Types: Cigars   Smokeless tobacco: Never   Tobacco comments:    occ. smokes black and milds  Vaping Use   Vaping Use: Some days  Substance Use Topics   Alcohol use: Yes    Comment: liquor every weekend   Drug use: Yes    Types: Marijuana     Allergies   Grapefruit extract   Review of Systems Review of Systems  Gastrointestinal:  Positive for abdominal pain and vomiting.  Per HPI   Physical Exam Triage Vital Signs ED Triage Vitals [11/13/21 1436]  Enc Vitals Group     BP      Pulse      Resp      Temp      Temp src      SpO2      Weight      Height      Head Circumference      Peak Flow      Pain Score 10     Pain Loc      Pain Edu?      Excl. in GC?    No data found.  Updated Vital Signs BP 91/63   Pulse 93   LMP 11/13/2021   SpO2 100%   Visual Acuity Right Eye Distance:   Left Eye Distance:   Bilateral Distance:    Right Eye Near:   Left Eye Near:    Bilateral Near:     Physical Exam Vitals and nursing note reviewed.  Constitutional:      Appearance: She is ill-appearing. She is not toxic-appearing.  HENT:     Head: Normocephalic and atraumatic.     Right Ear: Hearing and external ear normal.     Left Ear: Hearing and external ear normal.     Nose: Nose normal.     Mouth/Throat:     Lips: Pink.     Mouth: Mucous membranes are dry.     Pharynx: No posterior oropharyngeal erythema.  Eyes:     General: Lids are normal. Vision grossly intact. Gaze aligned appropriately.     Extraocular Movements: Extraocular movements intact.     Conjunctiva/sclera: Conjunctivae normal.     Pupils: Pupils are equal, round, and reactive to light.  Cardiovascular:     Rate and Rhythm: Normal rate and regular rhythm.      Heart sounds: Normal heart sounds, S1 normal and S2 normal.  Pulmonary:     Effort: Pulmonary effort is normal. No respiratory distress.     Breath sounds: Normal breath sounds and air entry.  Abdominal:     General: Abdomen is flat. Bowel sounds are normal.     Palpations: Abdomen is soft.     Tenderness: There is generalized abdominal tenderness. There is right CVA tenderness, guarding and rebound. There is no left CVA tenderness. Positive signs include McBurney's sign.     Comments: Rebound tenderness to the right lower quadrant.  Unable to perform full abdominal exam due to patient's positioning in the exam room related to discomfort.  Abdominal exam performed with patient standing up as she is uncomfortable laying down due to amount of severe pain to the generalized abdomen.  Musculoskeletal:     Cervical back: Neck supple.  Skin:    General: Skin is warm and dry.     Capillary Refill: Capillary refill takes less than 2 seconds.     Findings: No rash.  Neurological:     General: No focal deficit present.     Mental Status: She is alert and oriented to person, place, and time. Mental status is at baseline.     Cranial Nerves: No dysarthria or facial asymmetry.  Psychiatric:        Mood and Affect: Mood normal.        Speech: Speech normal.        Behavior: Behavior normal.        Thought Content: Thought content normal.        Judgment: Judgment normal.      UC Treatments / Results  Labs (all labs ordered are listed, but only abnormal results are displayed) Labs Reviewed  POCT URINALYSIS DIPSTICK, ED / UC - Abnormal; Notable for the following components:      Result Value   Bilirubin Urine SMALL (*)    Ketones, ur >=160 (*)    Hgb urine dipstick LARGE (*)    Protein, ur 100 (*)    All other components within normal limits  POC URINE PREG, ED  POC URINE PREG, ED    EKG   Radiology No results found.  Procedures Procedures (including critical care  time)  Medications  Ordered in UC Medications  ondansetron (ZOFRAN) injection 4 mg (4 mg Intramuscular Given 11/13/21 1449)  ondansetron (ZOFRAN) injection 4 mg (4 mg Intramuscular Given 11/13/21 1551)    Initial Impression / Assessment and Plan / UC Course  I have reviewed the triage vital signs and the nursing notes.  Pertinent labs & imaging results that were available during my care of the patient were reviewed by me and considered in my medical decision making (see chart for details).   1.  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  Patient's emesis and dry heaving did not respond well to 4 mg IM Zofran injection.  Patient is very uncomfortable in exam room and ill-appearing with significant generalized abdominal tenderness.  She is unable to find a position of comfort in exam room and continues to dry heave audibly.  Blood pressure is soft, heart rate is normal without tachycardia.  She appears slightly pale and dehydrated.  There is concern for intra-abdominal abnormality due to significant rebound tenderness to the right lower quadrant.   I recommend patient receive a further work-up in the emergency department due to severe abdominal pain.  She would benefit from some fluids and stat blood work as well as possible advanced imaging to rule out acute intra-abdominal pathology.  Offered [facility] transport to [facility] emergency department to which patient and family member initially responded yes.  Patient and family member have decided to go to med [facility] instead due to the likelihood of shorter wait time. I recommend patient go to [facility] ER as it is closest, but patient declines and would like to be transported to [facility] by her grandmother.  Vital signs are stable.  Discussed risks of deferring emergency department visit to which patient and family  member expressed understanding and agreement.  Recommend patient stop using marijuana as this is likely contributing significantly to her intractable vomiting.  Final Clinical Impressions(s) / UC Diagnoses   Final diagnoses:  Intractable cyclical vomiting  Generalized abdominal pain   Discharge Instructions   None    ED Prescriptions   None    PDMP not reviewed this encounter.   Carlisle Beers, [place] 11/13/21 1555

## 2021-11-13 NOTE — ED Triage Notes (Signed)
Pt presents from UC for abdominal pain and n/v/d. Reports sx started this morning around 11 am when she started her menstrual cycle. Hx of same. Per note, UC concerned for hyperemesis cannoboid syndrome.

## 2021-11-13 NOTE — ED Notes (Signed)
TC to [facility] for transport to ED

## 2021-11-13 NOTE — Discharge Instructions (Signed)
Please follow-up with your family doctor in the office.  I prescribed you nausea medicine to try and help you with your nausea at home.  Since this seems occur every time you have a menstrual cycle you should be evaluated by an OB/GYN.  I provided you information for the OB/GYN in our system.

## 2021-11-13 NOTE — ED Provider Notes (Signed)
[facility] EMERGENCY DEPARTMENT Provider Note   CSN: [id number] Arrival date & time: 11/13/21  1630     History  Chief Complaint  Patient presents with   Abdominal Pain   Emesis    Sonia Sanders is a 21 y.o. female.  21 yo F with a chief complaints of intractable nausea vomiting and diarrhea.  This been going on since yesterday.  She describes severe abdominal discomfort.  Was seen in urgent care and there was some concern for intra-abdominal pathology and was sent here for evaluation.  Patient actually has improved significantly since getting a dose of Zofran.  Feeling her pain is improved significantly.  She is not sure why this seems to happen every time she has her menstrual cycle though says that its been going on since she was very young.  She is at the point where she wants to have a hysterectomy.  She denies fevers or chills.  Denies suspicious food or drink intake.  Denies recent travel.   Abdominal Pain Associated symptoms: vomiting   Emesis Associated symptoms: abdominal pain        Home Medications Prior to Admission medications   Medication Sig Start Date End Date Taking? Authorizing Provider  ondansetron (ZOFRAN-ODT) 4 MG disintegrating tablet 4mg  ODT q4 hours prn nausea/vomit 11/13/21  Yes 13/7/23, DO  promethazine (PHENERGAN) 25 MG tablet Take 1 tablet (25 mg total) by mouth every 6 (six) hours as needed for nausea or vomiting. 08/28/21   08/30/21, MD      Allergies    Grapefruit extract    Review of Systems   Review of Systems  Gastrointestinal:  Positive for abdominal pain and vomiting.    Physical Exam Updated Vital Signs BP (!) 102/58 (BP Location: Right Arm)   Pulse 61   Temp 98.5 F (36.9 C) (Oral)   Resp 18   Ht 5\' 3"  (1.6 m)   Wt 54.4 kg   LMP 11/13/2021   SpO2 99%   BMI 21.26 kg/m  Physical Exam Vitals and nursing note reviewed.  Constitutional:      General: She is not in acute distress.    Appearance: She  is well-developed. She is not diaphoretic.  HENT:     Head: Normocephalic and atraumatic.  Eyes:     Pupils: Pupils are equal, round, and reactive to light.  Cardiovascular:     Rate and Rhythm: Normal rate and regular rhythm.     Heart sounds: No murmur heard.    No friction rub. No gallop.  Pulmonary:     Effort: Pulmonary effort is normal.     Breath sounds: No wheezing or rales.  Abdominal:     General: There is no distension.     Palpations: Abdomen is soft.     Tenderness: There is no abdominal tenderness.     Comments: Mild diffuse abdominal discomfort worse to the lower abdomen  Musculoskeletal:        General: No tenderness.     Cervical back: Normal range of motion and neck supple.  Skin:    General: Skin is warm and dry.  Neurological:     Mental Status: She is alert and oriented to person, place, and time.  Psychiatric:        Behavior: Behavior normal.     ED Results / Procedures / Treatments   Labs (all labs ordered are listed, but only abnormal results are displayed) Labs Reviewed  COMPREHENSIVE METABOLIC PANEL - Abnormal; Notable  for the following components:      Result Value   Potassium 3.3 (*)    CO2 19 (*)    Glucose, Bld 121 (*)    Total Protein 8.6 (*)    All other components within normal limits  URINALYSIS, ROUTINE W REFLEX MICROSCOPIC - Abnormal; Notable for the following components:   Bilirubin Urine SMALL (*)    Ketones, ur 40 (*)    Protein, ur 100 (*)    All other components within normal limits  RAPID URINE DRUG SCREEN, HOSP PERFORMED - Abnormal; Notable for the following components:   Tetrahydrocannabinol POSITIVE (*)    All other components within normal limits  CBC WITH DIFFERENTIAL/PLATELET - Abnormal; Notable for the following components:   WBC 14.3 (*)    Hemoglobin 11.9 (*)    HCT 35.2 (*)    Neutro Abs 13.0 (*)    All other components within normal limits  URINALYSIS, MICROSCOPIC (REFLEX) - Abnormal; Notable for the following  components:   Bacteria, UA RARE (*)    All other components within normal limits  LIPASE, BLOOD  PREGNANCY, URINE  CBC WITH DIFFERENTIAL/PLATELET    EKG None  Radiology No results found.  Procedures Procedures    Medications Ordered in ED Medications - No data to display  ED Course/ Medical Decision Making/ A&P                           Medical Decision Making Amount and/or Complexity of Data Reviewed Labs: ordered.  Risk Prescription drug management.   21 yo F with a chief complaints of nausea vomiting and diarrhea.  This been going on for couple days.  Reportedly she seems to have intractable nausea and vomiting every time she has a menstrual cycle.  She was seen in urgent care today and they sent her here for possible intra-abdominal pathology.  She is improved significantly since then.  She does have a leukocytosis with neutrophilic predominance LFTs and lipase are unremarkable.  We will oral trial here.  Reassess.  Patient able to tolerate by mouth without issue.  UA negative for infection.  Pregnancy test negative.  Discharge home.  11:14 PM:  I have discussed the diagnosis/risks/treatment options with the patient and family.  Evaluation and diagnostic testing in the emergency department does not suggest an emergent condition requiring admission or immediate intervention beyond what has been performed at this time.  They will follow up with PCP. We also discussed returning to the ED immediately if new or worsening sx occur. We discussed the sx which are most concerning (e.g., sudden worsening pain, fever, inability to tolerate by mouth) that necessitate immediate return. Medications administered to the patient during their visit and any new prescriptions provided to the patient are listed below.  Medications given during this visit Medications - No data to display   The patient appears reasonably screen and/or stabilized for discharge and I doubt any other medical  condition or other [facility] requiring further screening, evaluation, or treatment in the ED at this time prior to discharge.          Final Clinical Impression(s) / ED Diagnoses Final diagnoses:  Nausea vomiting and diarrhea    Rx / DC Orders ED Discharge Orders          Ordered    ondansetron (ZOFRAN-ODT) 4 MG disintegrating tablet        11/13/21 2243  Melene Plan, DO 11/13/21 2314

## 2021-11-13 NOTE — ED Notes (Signed)
No vomiting since arrival to ED. Pt requesting food/drink per front desk staff.

## 2021-11-13 NOTE — ED Triage Notes (Signed)
Pt reports starting her period this morning and sever ABD pain and vomiting. Pt reports she often has ABD pain and vomiting when  her period starts. Pt reports this is the worst.

## 2021-11-13 NOTE — ED Notes (Signed)
Pt requested ginger ale to drink and nabs and pop corn to eat. Pt given the same

## 2021-11-13 NOTE — ED Notes (Signed)
Called and canceled [facility] transfer request per Sharyn Lull NP

## 2022-05-15 ENCOUNTER — Encounter (HOSPITAL_COMMUNITY): Payer: Self-pay | Admitting: Emergency Medicine

## 2022-05-15 ENCOUNTER — Other Ambulatory Visit: Payer: Self-pay

## 2022-05-15 ENCOUNTER — Ambulatory Visit (INDEPENDENT_AMBULATORY_CARE_PROVIDER_SITE_OTHER): Payer: Medicaid Other

## 2022-05-15 ENCOUNTER — Telehealth (HOSPITAL_COMMUNITY): Payer: Self-pay | Admitting: Emergency Medicine

## 2022-05-15 ENCOUNTER — Ambulatory Visit (HOSPITAL_COMMUNITY)
Admission: EM | Admit: 2022-05-15 | Discharge: 2022-05-15 | Disposition: A | Discharge status: Home / Self Care | Payer: Medicaid Other | Attending: Emergency Medicine | Admitting: Emergency Medicine

## 2022-05-15 DIAGNOSIS — M549 Dorsalgia, unspecified: Secondary | ICD-10-CM

## 2022-05-15 DIAGNOSIS — M25511 Pain in right shoulder: Secondary | ICD-10-CM

## 2022-05-15 MED ORDER — IBUPROFEN 800 MG PO TABS: 800.0000 mg | ORAL_TABLET | Freq: Three times a day (TID) | ORAL | 0 refills | Status: DC

## 2022-05-15 MED ORDER — CYCLOBENZAPRINE HCL 10 MG PO TABS: 10.0000 mg | ORAL_TABLET | Freq: Three times a day (TID) | ORAL | 0 refills | Status: DC | PRN

## 2022-05-15 MED ORDER — KETOROLAC TROMETHAMINE 30 MG/ML IJ SOLN
INTRAMUSCULAR | Status: AC
  Filled 2022-05-15: qty 1

## 2022-05-15 MED ORDER — KETOROLAC TROMETHAMINE 30 MG/ML IJ SOLN
30.0000 mg | Freq: Once | INTRAMUSCULAR | Status: AC
  Administered 2022-05-15: 30 mg via INTRAMUSCULAR
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
## 2022-05-15 NOTE — Discharge Instructions (Addendum)
All of your xrays today are negative Starting tomorrow, I recommend taking ibuprofen alternated with Tylenol. You take the muscle relaxer 3 times daily as needed.  If this medicine makes you drowsy, take only before bed You can also apply ice to the shoulder/clavicle  The sling is provided just for comfort.  Please do not wear this for longer than several hours at a time. Continue to move the arm and shoulder as much as tolerated   Please go to the emergency department if symptoms worsen.
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
## 2022-09-18 ENCOUNTER — Encounter (HOSPITAL_COMMUNITY): Payer: Self-pay | Admitting: Emergency Medicine

## 2022-09-18 ENCOUNTER — Ambulatory Visit (HOSPITAL_COMMUNITY)
Admission: EM | Admit: 2022-09-18 | Discharge: 2022-09-18 | Disposition: A | Discharge status: Home / Self Care | Payer: Medicaid Other | Attending: Emergency Medicine | Admitting: Emergency Medicine

## 2022-09-18 DIAGNOSIS — J069 Acute upper respiratory infection, unspecified: Secondary | ICD-10-CM | POA: Diagnosis not present

## 2022-09-18 DIAGNOSIS — U071 COVID-19: Secondary | ICD-10-CM | POA: Diagnosis present

## 2022-09-18 MED ORDER — ACETAMINOPHEN 500 MG PO TABS: 500.0000 mg | ORAL_TABLET | Freq: Four times a day (QID) | ORAL | 0 refills | Status: DC | PRN

## 2022-09-18 MED ORDER — IBUPROFEN 800 MG PO TABS: 800.0000 mg | ORAL_TABLET | Freq: Three times a day (TID) | ORAL | 0 refills | Status: DC

## 2022-09-18 NOTE — ED Provider Notes (Signed)
[facility]    CSN: [id number] Arrival date & time: 09/18/22  4098      History   Chief Complaint Chief Complaint  Patient presents with   Generalized Body Aches    HPI Sonia Sanders is a 22 y.o. female.   Patient presents to clinic for complaints of generalized bodyaches, chills, sweats, nonproductive cough, and nasal congestion for the past 4 days.  Subjective fever at home, has not measured temperature.  Took Tylenol last night.  Has not had any medication today.  Denies chest pain, shortness of breath, wheezing, abdominal pain, nausea, vomiting or diarrhea.    The history is provided by the patient and medical records.    Past Medical History:  Diagnosis Date   Depression    Seasonal allergies     There are no problems to display for this patient.   History reviewed. No pertinent surgical history.  OB History   No obstetric history on file.      Home Medications    Prior to Admission medications   Medication Sig Start Date End Date Taking? Authorizing Provider  acetaminophen (TYLENOL) 500 MG tablet Take 1 tablet (500 mg total) by mouth every 6 (six) hours as needed. 09/18/22  Yes Rinaldo Ratel, [place] N, FNP  ibuprofen (ADVIL) 800 MG tablet Take 1 tablet (800 mg total) by mouth 3 (three) times daily. 09/18/22  Yes Ettore Trebilcock, [place] N, FNP    Family History Family History  Problem Relation Age of Onset   Crohn's disease Mother    Healthy Father     Social History Social History   Tobacco Use   Smoking status: Light Smoker    Types: Cigars   Smokeless tobacco: Never   Tobacco comments:    occ. smokes black and milds  Vaping Use   Vaping status: Some Days  Substance Use Topics   Alcohol use: Yes    Comment: liquor every weekend   Drug use: Yes    Types: Marijuana     Allergies   Grapefruit extract   Review of Systems Review of Systems  Constitutional:  Positive for appetite change, chills, diaphoresis, fatigue and  fever.  HENT:  Positive for sore throat.   Respiratory:  Positive for cough. Negative for shortness of breath and wheezing.   Cardiovascular:  Negative for chest pain.  Gastrointestinal:  Negative for abdominal pain, nausea and vomiting.  Musculoskeletal:  Positive for arthralgias and myalgias.     Physical Exam Triage Vital Signs ED Triage Vitals [09/18/22 1104]  Encounter Vitals Group     BP 102/69     Systolic BP Percentile      Diastolic BP Percentile      Pulse Rate 75     Resp 14     Temp 98.5 F (36.9 C)     Temp Source Oral     SpO2 98 %     Weight      Height      Head Circumference      Peak Flow      Pain Score 8     Pain Loc      Pain Education      Exclude from Growth Chart    No data found.  Updated Vital Signs BP 102/69 (BP Location: Left Arm)   Pulse 75   Temp 98.5 F (36.9 C) (Oral)   Resp 14   LMP 09/11/2022   SpO2 98%   Visual Acuity Right Eye Distance:   Left  Eye Distance:   Bilateral Distance:    Right Eye Near:   Left Eye Near:    Bilateral Near:     Physical Exam Vitals and nursing note reviewed.  Constitutional:      Appearance: Normal appearance.  HENT:     Head: Normocephalic and atraumatic.     Right Ear: External ear normal.     Left Ear: External ear normal.     Nose: Nose normal.     Mouth/Throat:     Mouth: Mucous membranes are moist.     Pharynx: Posterior oropharyngeal erythema present.  Eyes:     Conjunctiva/sclera: Conjunctivae normal.  Cardiovascular:     Rate and Rhythm: Normal rate and regular rhythm.     Heart sounds: Normal heart sounds. No murmur heard. Pulmonary:     Effort: Pulmonary effort is normal. No respiratory distress.     Breath sounds: Normal breath sounds.  Musculoskeletal:        General: Normal range of motion.     Cervical back: Normal range of motion.  Skin:    General: Skin is warm and dry.  Neurological:     General: No focal deficit present.     Mental Status: She is alert and  oriented to person, place, and time.  Psychiatric:        Mood and Affect: Mood normal.        Behavior: Behavior normal.      UC Treatments / Results  Labs (all labs ordered are listed, but only abnormal results are displayed) Labs Reviewed  SARS CORONAVIRUS 2 (TAT 6-24 HRS)    EKG   Radiology No results found.  Procedures Procedures (including critical care time)  Medications Ordered in UC Medications - No data to display  Initial Impression / Assessment and Plan / UC Course  I have reviewed the triage vital signs and the nursing notes.  Pertinent labs & imaging results that were available during my care of the patient were reviewed by me and considered in my medical decision making (see chart for details).  Vitals and triage reviewed, patient is hemodynamically stable.  Lungs are vesicular, heart with regular rate and rhythm. Symptoms are consistent with viral URI, COVID-19 testing obtained.  Symptomatic management discussed.  Plan of care, follow-up care return precautions given, no questions at this time.  Work note provided.     Final Clinical Impressions(s) / UC Diagnoses   Final diagnoses:  Viral URI with cough     Discharge Instructions      We have swabbed you for COVID-19 and our staff will contact you if positive, results will also be available in MyChart.  Your symptoms are consistent with a viral illness, you can alternate between 500 mg of Tylenol and 800 mg of ibuprofen every 4-6 hours.  Ensure you are staying well-hydrated with at least 64 ounces of fluids.  For sore throat you can do warm saline gargles, sleep with a humidifier, and drink tea with honey.  Popsicles can sometimes help relieve sore throat as well.  Return to clinic if no improvement over the next 3 to 5 days, or any new concerning symptoms develop.      ED Prescriptions     Medication Sig Dispense Auth. Provider   acetaminophen (TYLENOL) 500 MG tablet Take 1 tablet (500 mg  total) by mouth every 6 (six) hours as needed. 30 tablet Rinaldo Ratel, [place] N, [place]   ibuprofen (ADVIL) 800 MG tablet Take 1 tablet (800 mg  total) by mouth 3 (three) times daily. 21 tablet Tyjai Charbonnet, [place] N, [place]      PDMP not reviewed this encounter.   Erico Stan, [place] N, [place] 09/18/22 [phone]

## 2022-09-18 NOTE — Discharge Instructions (Addendum)
We have swabbed you for COVID-19 and our staff will contact you if positive, results will also be available in MyChart.  Your symptoms are consistent with a viral illness, you can alternate between 500 mg of Tylenol and 800 mg of ibuprofen every 4-6 hours.  Ensure you are staying well-hydrated with at least 64 ounces of fluids.  For sore throat you can do warm saline gargles, sleep with a humidifier, and drink tea with honey.  Popsicles can sometimes help relieve sore throat as well.  Return to clinic if no improvement over the next 3 to 5 days, or any new concerning symptoms develop.

## 2022-09-18 NOTE — ED Triage Notes (Signed)
Body aches, chills, sweats, cough, congestion. Took tylenol.

## 2022-09-19 LAB — SARS CORONAVIRUS 2 (TAT 6-24 HRS): SARS Coronavirus 2: POSITIVE — AB
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
## 2023-01-24 ENCOUNTER — Ambulatory Visit (HOSPITAL_COMMUNITY)
Admission: EM | Admit: 2023-01-24 | Discharge: 2023-01-24 | Discharge status: Against Medical Advice | Payer: Medicaid Other

## 2023-01-24 NOTE — ED Notes (Signed)
Informed by the front desk staff that Patient has LWBS before triage.

## 2023-01-25 ENCOUNTER — Ambulatory Visit (INDEPENDENT_AMBULATORY_CARE_PROVIDER_SITE_OTHER): Payer: Medicaid Other

## 2023-01-25 ENCOUNTER — Ambulatory Visit (HOSPITAL_COMMUNITY)
Admission: EM | Admit: 2023-01-25 | Discharge: 2023-01-25 | Discharge status: Home / Self Care | Payer: Medicaid Other | Attending: Emergency Medicine

## 2023-01-25 ENCOUNTER — Encounter (HOSPITAL_COMMUNITY): Payer: Self-pay

## 2023-01-25 VITALS — BP 109/71 | HR 63 | Temp 98.0°F | Resp 16 | Ht 63.0 in | Wt 120.0 lb

## 2023-01-25 DIAGNOSIS — M67432 Ganglion, left wrist: Secondary | ICD-10-CM

## 2023-01-25 DIAGNOSIS — M546 Pain in thoracic spine: Secondary | ICD-10-CM | POA: Diagnosis not present

## 2023-01-25 MED ORDER — NAPROXEN 375 MG PO TABS: 375.0000 mg | ORAL_TABLET | Freq: Two times a day (BID) | ORAL | 0 refills | Status: AC

## 2023-01-25 MED ORDER — METHOCARBAMOL 500 MG PO TABS: 500.0000 mg | ORAL_TABLET | Freq: Two times a day (BID) | ORAL | 0 refills | Status: AC

## 2023-01-25 NOTE — Discharge Instructions (Addendum)
Your imaging did not show any acute fractures or bony deformities of your wrist.  The growth is most likely a ganglion cyst.  Half of these will go away on their own, the other half may require drainage or surgical removal.  If this persist she can follow-up with an orthopedic for further evaluation.  Your back pain appears to be muscular.  Take the naproxen twice daily with food and you can use the muscle relaxer as needed.  Do not drink alcohol or drive on the muscle relaxer as it causes sedation.  Heat, rest and gentle stretching may also help with your back pain.  If this persist follow-up with an orthopedic.  Return to clinic for any new or urgent symptoms.

## 2023-01-25 NOTE — ED Triage Notes (Signed)
Back pain and left wrist cyst. States she works in [employer] and was lifting a Patient off the toilet, that's when the back pain started. States there is a mass sticking out on the left wrist for a while that has now gotten larger.

## 2023-01-25 NOTE — ED Provider Notes (Signed)
[facility]    CSN: [id number] Arrival date & time: 01/25/23  1129      History   Chief Complaint Chief Complaint  Patient presents with   Appointment   Back Pain   Wrist Pain    HPI Sonia Sanders is a 23 y.o. female.    Patient presents to clinic over concern of a protrusion on her left palmar aspect of her wrist that has been present for months.  Thinks the area has gotten larger for the past few months.  It is not painful.  No trauma or falls.  Denies any injuries.  Has not tried any interventions for this.  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  She continued to work through this and then went to her second job at [employer].  Working, lifting, bending and twisting make the pain worse.  She has not had any numbness or tingling.  No falls.  No incontinence.  Has not tried any medications or interventions for her back pain.  Patient is also concerned about her posture and feels like she is crooked, seems to be more comfortable leaning to the right side.  No personal or family history of scoliosis.    The history is provided by the patient and medical records.  Back Pain Wrist Pain    Past Medical History:  Diagnosis Date   Depression    Seasonal allergies     There are no active problems to display for this patient.   History reviewed. No pertinent surgical history.  OB History   No obstetric history on file.      Home Medications    Prior to Admission medications   Medication Sig Start Date End Date Taking? Authorizing Provider  methocarbamol (ROBAXIN) 500 MG tablet Take 1 tablet (500 mg total) by mouth 2 (two) times daily. 01/25/23  Yes Rinaldo Ratel, [place], FNP  naproxen (NAPROSYN) 375 MG tablet Take 1 tablet (375 mg total) by mouth 2 (two) times daily. 01/25/23  Yes Jaeleen Inzunza, [place], FNP    Family History Family History  Problem Relation Age of Onset    Crohn's disease Mother    Healthy Father     Social History Social History   Tobacco Use   Smoking status: Light Smoker    Types: Cigars   Smokeless tobacco: Never   Tobacco comments:    occ. smokes black and milds  Vaping Use   Vaping status: Some Days   Substances: Nicotine, Flavoring  Substance Use Topics   Alcohol use: Yes    Comment: liquor every weekend   Drug use: Yes    Types: Marijuana     Allergies   Grapefruit extract   Review of Systems Review of Systems  Per HPI   Physical Exam Triage Vital Signs ED Triage Vitals  Encounter Vitals Group     BP 01/25/23 1202 109/71     Systolic BP Percentile --      Diastolic BP Percentile --      Pulse Rate 01/25/23 1202 63     Resp 01/25/23 1202 16     Temp 01/25/23 1202 98 F (36.7 C)     Temp Source 01/25/23 1202 Oral     SpO2 01/25/23 1202 96 %     Weight 01/25/23 1202 120 lb (54.4 kg)     Height 01/25/23 1202 5\' 3"  (1.6 m)     Head  Circumference --      Peak Flow --      Pain Score 01/25/23 1200 8     Pain Loc --      Pain Education --      Exclude from Growth Chart --    No data found.  Updated Vital Signs BP 109/71 (BP Location: Right Arm)   Pulse 63   Temp 98 F (36.7 C) (Oral)   Resp 16   Ht 5\' 3"  (1.6 m)   Wt 120 lb (54.4 kg)   LMP 01/20/2023 (Approximate)   SpO2 96%   BMI 21.26 kg/m   Visual Acuity Right Eye Distance:   Left Eye Distance:   Bilateral Distance:    Right Eye Near:   Left Eye Near:    Bilateral Near:     Physical Exam Vitals and nursing note reviewed.  Constitutional:      Appearance: Normal appearance.  HENT:     Head: Normocephalic and atraumatic.     Right Ear: External ear normal.     Left Ear: External ear normal.     Nose: Nose normal.     Mouth/Throat:     Mouth: Mucous membranes are moist.  Eyes:     Conjunctiva/sclera: Conjunctivae normal.  Cardiovascular:     Rate and Rhythm: Normal rate.  Pulmonary:     Effort: Pulmonary effort is normal.  No respiratory distress.  Musculoskeletal:        General: Tenderness present. Normal range of motion.     Right wrist: Tenderness present.       Arms:     Thoracic back: Spasms present.       Back:     Comments: Protrusion to the right palmar aspect of the wrist.  Tender to deep palpation.  No overlying skin changes.  No pain with range of motion.  Thoracic muscular tenderness to palpation and with range of motion.  Spine without step-off or deformity.  Patient bent forward from the hips and hung her arms.  No obvious spinal rotation or gross deformity suggestive of scoliosis noted.   Skin:    General: Skin is warm and dry.  Neurological:     General: No focal deficit present.     Mental Status: She is alert and oriented to person, place, and time.  Psychiatric:        Mood and Affect: Mood normal.        Behavior: Behavior normal. Behavior is cooperative.      UC Treatments / Results  Labs (all labs ordered are listed, but only abnormal results are displayed) Labs Reviewed - No data to display  EKG   Radiology DG Wrist Complete Left Result Date: 01/25/2023 CLINICAL DATA:  possible ganglion cyst? Hard prominence on left wrist EXAM: LEFT WRIST - COMPLETE 3+ VIEW COMPARISON:  None Available. FINDINGS: There is no evidence of fracture or dislocation. There is no evidence of arthropathy or other focal bone abnormality. Soft tissues are unremarkable. IMPRESSION: Negative. Electronically Signed   By: Tish Frederickson M.D.   On: 01/25/2023 13:04    Procedures Procedures (including critical care time)  Medications Ordered in UC Medications - No data to display  Initial Impression / Assessment and Plan / UC Course  I have reviewed the triage vital signs and the nursing notes.  Pertinent labs & imaging results that were available during my care of the patient were reviewed by me and considered in my medical decision making (see chart for details).  Vitals and triage reviewed,  patient is hemodynamically stable.  Upon bending from the waist there is no obvious spinal rotation or obvious scoliosis.  Muscular back pain when raising up from bending at the hips bilaterally in the thoracic spine.  No step-off or deformity noted.  Imaging of the spine deferred due to lack of trauma reassuring physical examination.  Anti-inflammatories and muscle relaxers as well as symptomatic management reviewed.  Left wrist has a hard round structure on the palmar aspect that is around the size of a dime.  Imaging does not show any acute fractures or bony abnormalities.  Suspect ganglion cyst.  Area is not causing any pain, treatment deferred at this time.  Orthopedic follow-up if symptoms develop.  Plan of care, follow-up care return precautions given, no questions at this time.  Work note provided.     Final Clinical Impressions(s) / UC Diagnoses   Final diagnoses:  Ganglion cyst of wrist, left  Acute bilateral thoracic back pain     Discharge Instructions      Your imaging did not show any acute fractures or bony deformities of your wrist.  The growth is most likely a ganglion cyst.  Half of these will go away on their own, the other half may require drainage or surgical removal.  If this persist she can follow-up with an orthopedic for further evaluation.  Your back pain appears to be muscular.  Take the naproxen twice daily with food and you can use the muscle relaxer as needed.  Do not drink alcohol or drive on the muscle relaxer as it causes sedation.  Heat, rest and gentle stretching may also help with your back pain.  If this persist follow-up with an orthopedic.  Return to clinic for any new or urgent symptoms.      ED Prescriptions     Medication Sig Dispense Auth. Provider   methocarbamol (ROBAXIN) 500 MG tablet Take 1 tablet (500 mg total) by mouth 2 (two) times daily. 20 tablet Rinaldo Ratel, [place], [place]   naproxen (NAPROSYN) 375 MG tablet Take 1 tablet (375 mg  total) by mouth 2 (two) times daily. 20 tablet Somtochukwu Woollard, [place], [place]      PDMP not reviewed this encounter.   Rhythm Wigfall, [place], [place] 01/25/23 1310

## 2023-02-08 ENCOUNTER — Encounter (HOSPITAL_COMMUNITY): Payer: Self-pay | Admitting: Emergency Medicine

## 2023-02-08 ENCOUNTER — Ambulatory Visit (HOSPITAL_COMMUNITY)
Admission: EM | Admit: 2023-02-08 | Discharge: 2023-02-08 | Disposition: A | Discharge status: Home / Self Care | Payer: Medicaid Other

## 2023-02-08 DIAGNOSIS — S61213A Laceration without foreign body of left middle finger without damage to nail, initial encounter: Secondary | ICD-10-CM

## 2023-02-08 NOTE — ED Provider Notes (Signed)
[facility]    CSN: [id number] Arrival date & time: 02/08/23  1758      History   Chief Complaint Chief Complaint  Patient presents with   Laceration    HPI Sonia Sanders is a 23 y.o. female.   Patient presents to clinic over concern of a laceration to her left middle finger, she was cutting onions.  Reports it was bleeding prior to arrival, bleeding controlled in clinic.  Small laceration to distal middle finger without nailbed involvement.  Patient reports some pain and discomfort.  The history is provided by the patient and medical records.  Laceration   Past Medical History:  Diagnosis Date   Depression    Seasonal allergies     There are no active problems to display for this patient.   History reviewed. No pertinent surgical history.  OB History   No obstetric history on file.      Home Medications    Prior to Admission medications   Medication Sig Start Date End Date Taking? Authorizing Provider  methocarbamol (ROBAXIN) 500 MG tablet Take 1 tablet (500 mg total) by mouth 2 (two) times daily. 01/25/23   Cielo Arias, [place], FNP  naproxen (NAPROSYN) 375 MG tablet Take 1 tablet (375 mg total) by mouth 2 (two) times daily. 01/25/23   Chipper Koudelka, [place], FNP    Family History Family History  Problem Relation Age of Onset   Crohn's disease Mother    Healthy Father     Social History Social History   Tobacco Use   Smoking status: Light Smoker    Types: Cigars   Smokeless tobacco: Never   Tobacco comments:    occ. smokes black and milds  Vaping Use   Vaping status: Some Days   Substances: Nicotine, Flavoring  Substance Use Topics   Alcohol use: Yes    Comment: liquor every weekend   Drug use: Yes    Types: Marijuana     Allergies   Grapefruit extract   Review of Systems Review of Systems  Per HPI   Physical Exam Triage Vital Signs ED Triage Vitals  Encounter Vitals Group     BP 02/08/23 1834 118/70     Systolic  BP Percentile --      Diastolic BP Percentile --      Pulse Rate 02/08/23 1834 71     Resp 02/08/23 1834 14     Temp 02/08/23 1834 97.9 F (36.6 C)     Temp Source 02/08/23 1834 Oral     SpO2 02/08/23 1834 100 %     Weight --      Height --      Head Circumference --      Peak Flow --      Pain Score 02/08/23 1832 7     Pain Loc --      Pain Education --      Exclude from Growth Chart --    No data found.  Updated Vital Signs BP 118/70 (BP Location: Right Arm)   Pulse 71   Temp 97.9 F (36.6 C) (Oral)   Resp 14   LMP 01/20/2023 (Approximate)   SpO2 100%   Visual Acuity Right Eye Distance:   Left Eye Distance:   Bilateral Distance:    Right Eye Near:   Left Eye Near:    Bilateral Near:     Physical Exam Vitals and nursing note reviewed.  Constitutional:      Appearance: Normal appearance.  HENT:     Head: Normocephalic and atraumatic.     Right Ear: External ear normal.     Left Ear: External ear normal.     Nose: Nose normal.     Mouth/Throat:     Mouth: Mucous membranes are moist.  Eyes:     Conjunctiva/sclera: Conjunctivae normal.  Cardiovascular:     Pulses: Normal pulses.  Pulmonary:     Effort: Pulmonary effort is normal. No respiratory distress.  Musculoskeletal:        General: Normal range of motion.  Skin:    General: Skin is warm and dry.     Capillary Refill: Capillary refill takes less than 2 seconds.     Findings: Laceration present.  Neurological:     General: No focal deficit present.     Mental Status: She is alert and oriented to person, place, and time.  Psychiatric:        Mood and Affect: Mood normal.        Behavior: Behavior normal.      UC Treatments / Results  Labs (all labs ordered are listed, but only abnormal results are displayed) Labs Reviewed - No data to display  EKG   Radiology No results found.  Procedures Procedures (including critical care time)  Medications Ordered in UC Medications - No data to  display  Initial Impression / Assessment and Plan / UC Course  I have reviewed the triage vital signs and the nursing notes.  Pertinent labs & imaging results that were available during my care of the patient were reviewed by me and considered in my medical decision making (see chart for details).  Vitals and triage reviewed, patient is hemodynamically stable.  Small laceration to distal middle finger without nailbed involvement.  Edges already approximated, no need for sutures at this time.  Area cleaned with Hibiclens and skin glue applied.  Proper wound care discussed, will withhold antibiotics due to clean knife, diffuse bleeding and cleaning in clinic.  Nonsutured laceration care discussed verbally.  Plan of care, follow-up care return precautions given, no questions at this time.     Final Clinical Impressions(s) / UC Diagnoses   Final diagnoses:  Laceration of left middle finger without foreign body without damage to nail, initial encounter     Discharge Instructions      Keep the area clean and dry.  Do not pick at the area.  Keep it clean and dry.  Return to clinic for any signs of infection.  You can take 8 or milligrams of ibuprofen every 8 hours as needed for any pain or inflammation.    ED Prescriptions   None    PDMP not reviewed this encounter.   Phylisha Dix, [place], [place] 02/08/23 [phone]

## 2023-02-08 NOTE — Discharge Instructions (Addendum)
Keep the area clean and dry.  Do not pick at the area.  Keep it clean and dry.  Return to clinic for any signs of infection.  You can take 8 or milligrams of ibuprofen every 8 hours as needed for any pain or inflammation.

## 2023-02-08 NOTE — ED Triage Notes (Signed)
Pt reports that cutting onions with a knife and cut left middle finger. Pt has wrapped in triage controlling bleeding.
